# Patient Record
Sex: Female | Born: 1995 | Race: Black or African American | Hispanic: No | Marital: Single | Smoking: Never smoker
Health system: Southern US, Community
[De-identification: ages and names within clinical notes are randomized; demographics above are authoritative.]

## PROBLEM LIST (undated history)

## (undated) DIAGNOSIS — K219 Gastro-esophageal reflux disease without esophagitis: Secondary | ICD-10-CM

## (undated) HISTORY — PX: NO PAST SURGERIES: SHX2092

---

## 2015-05-29 ENCOUNTER — Encounter (HOSPITAL_COMMUNITY): Payer: Self-pay | Admitting: Family Medicine

## 2015-05-29 ENCOUNTER — Emergency Department (HOSPITAL_COMMUNITY)
Admission: EM | Admit: 2015-05-29 | Discharge: 2015-05-29 | Disposition: A | Discharge status: Home / Self Care | Payer: Medicaid Other | Attending: Emergency Medicine | Admitting: Emergency Medicine

## 2015-05-29 DIAGNOSIS — Z3202 Encounter for pregnancy test, result negative: Secondary | ICD-10-CM | POA: Diagnosis not present

## 2015-05-29 DIAGNOSIS — K219 Gastro-esophageal reflux disease without esophagitis: Secondary | ICD-10-CM | POA: Insufficient documentation

## 2015-05-29 DIAGNOSIS — R109 Unspecified abdominal pain: Secondary | ICD-10-CM | POA: Diagnosis present

## 2015-05-29 HISTORY — DX: Gastro-esophageal reflux disease without esophagitis: K21.9

## 2015-05-29 LAB — COMPREHENSIVE METABOLIC PANEL
ALBUMIN: 4.1 g/dL (ref 3.5–5.0)
ALK PHOS: 71 U/L (ref 38–126)
ALT: 14 U/L (ref 14–54)
AST: 18 U/L (ref 15–41)
Anion gap: 6 (ref 5–15)
BILIRUBIN TOTAL: 0.5 mg/dL (ref 0.3–1.2)
BUN: 6 mg/dL (ref 6–20)
CALCIUM: 9.4 mg/dL (ref 8.9–10.3)
CO2: 27 mmol/L (ref 22–32)
CREATININE: 0.69 mg/dL (ref 0.44–1.00)
Chloride: 104 mmol/L (ref 101–111)
GFR calc non Af Amer: >60 mL/min (ref >60)
GLUCOSE: 105 mg/dL — AB (ref 65–99)
Potassium: 3.9 mmol/L (ref 3.5–5.1)
SODIUM: 137 mmol/L (ref 135–145)
Total Protein: 7.7 g/dL (ref 6.5–8.1)

## 2015-05-29 LAB — URINALYSIS, ROUTINE W REFLEX MICROSCOPIC
BILIRUBIN URINE: NEGATIVE
GLUCOSE, UA: NEGATIVE mg/dL
HGB URINE DIPSTICK: NEGATIVE
KETONES UR: NEGATIVE mg/dL
Leukocytes, UA: NEGATIVE
Nitrite: NEGATIVE
PROTEIN: NEGATIVE mg/dL
Specific Gravity, Urine: 1.02 (ref 1.005–1.030)
Urobilinogen, UA: 1 mg/dL (ref 0.0–1.0)
pH: 7 (ref 5.0–8.0)

## 2015-05-29 LAB — CBC
HCT: 37.7 % (ref 36.0–46.0)
Hemoglobin: 12.9 g/dL (ref 12.0–15.0)
MCH: 27.8 pg (ref 26.0–34.0)
MCHC: 34.2 g/dL (ref 30.0–36.0)
MCV: 81.3 fL (ref 78.0–100.0)
PLATELETS: 405 10*3/uL — AB (ref 150–400)
RBC: 4.64 MIL/uL (ref 3.87–5.11)
RDW: 13.6 % (ref 11.5–15.5)
WBC: 10 10*3/uL (ref 4.0–10.5)

## 2015-05-29 LAB — POC URINE PREG, ED: Preg Test, Ur: NEGATIVE

## 2015-05-29 LAB — LIPASE, BLOOD: Lipase: 18 U/L — ABNORMAL LOW (ref 22–51)

## 2015-05-29 MED ORDER — OMEPRAZOLE 20 MG PO CPDR: 20.0000 mg | DELAYED_RELEASE_CAPSULE | Freq: Every day | ORAL | Status: DC

## 2015-05-29 MED ORDER — GI COCKTAIL ~~LOC~~
30.0000 mL | Freq: Once | ORAL | Status: AC
  Administered 2015-05-29: 30 mL via ORAL
  Filled 2015-05-29: qty 30

## 2015-05-29 NOTE — ED Notes (Signed)
Pt is from home [facility] Pt has a history of GERD. Last night, pt ate spaghetti and started experiencing upper gastric pain. Since being transported, pain has decreased.

## 2015-05-29 NOTE — ED Notes (Signed)
Patient is unable to urinate at this time and will try later. She is aware a urine sample is needed.

## 2015-05-29 NOTE — ED Provider Notes (Signed)
CSN: [id number]     Arrival date & time 05/29/15  0214 History   First MD Initiated Contact with Patient 05/29/15 [phone]     Chief Complaint  Patient presents with  . Abdominal Pain     (Consider location/radiation/quality/duration/timing/severity/associated sxs/prior Treatment) Patient is a 19 y.o. female presenting with GERD. The history is provided by the patient.  Gastroesophageal Reflux This is a recurrent problem. The current episode started 6 to 12 hours ago. The problem occurs constantly. The problem has not changed since onset.Pertinent negatives include no chest pain, no headaches and no shortness of breath. Nothing aggravates the symptoms. Nothing relieves the symptoms. She has tried nothing for the symptoms. The treatment provided no relief.    Past Medical History  Diagnosis Date  . GERD (gastroesophageal reflux disease)    History reviewed. No pertinent past surgical history. History reviewed. No pertinent family history. Social History  Substance Use Topics  . Smoking status: Never Smoker   . Smokeless tobacco: None  . Alcohol Use: No   OB History    No data available     Review of Systems  Respiratory: Negative for shortness of breath.   Cardiovascular: Negative for chest pain, palpitations and leg swelling.  Neurological: Negative for headaches.  All other systems reviewed and are negative.     Allergies  Review of patient's allergies indicates no known allergies.  Home Medications   Prior to Admission medications   Not on File   BP 135/71 mmHg  Pulse 63  Temp(Src) 98.9 F (37.2 C) (Oral)  Resp 18  Ht  (1.676 m)  Wt 264 lb (119.75 kg)  BMI 42.63 kg/m2  SpO2 100%  LMP 05/07/2015 Physical Exam  Constitutional: She is oriented to person, place, and time. She appears well-developed and well-nourished. No distress.  HENT:  Head: Normocephalic and atraumatic.  Mouth/Throat: Oropharynx is clear and moist.  Eyes: Conjunctivae and EOM are  normal. Pupils are equal, round, and reactive to light.  Neck: Normal range of motion. Neck supple.  Cardiovascular: Normal rate, regular rhythm and intact distal pulses.   Pulmonary/Chest: Effort normal and breath sounds normal. No respiratory distress. She has no wheezes. She has no rales.  Abdominal: Soft. Bowel sounds are increased. There is no tenderness. There is no rigidity, no rebound, no guarding, no tenderness at McBurney's point and negative Murphy's sign.  Musculoskeletal: Normal range of motion.  Neurological: She is alert and oriented to person, place, and time.  Skin: Skin is warm and dry.  Psychiatric: She has a normal mood and affect.    ED Course  Procedures (including critical care time) Labs Review Labs Reviewed  LIPASE, BLOOD - Abnormal; Notable for the following:    Lipase 18 (*)    All other components within normal limits  COMPREHENSIVE METABOLIC PANEL - Abnormal; Notable for the following:    Glucose, Bld 105 (*)    All other components within normal limits  CBC - Abnormal; Notable for the following:    Platelets 405 (*)    All other components within normal limits  URINALYSIS, ROUTINE W REFLEX MICROSCOPIC (NOT AT [facility]) - Abnormal; Notable for the following:    APPearance CLOUDY (*)    All other components within normal limits  POC URINE PREG, ED    Imaging Review No results found. I have personally reviewed and evaluated these images and lab results as part of my medical decision-making.   EKG Interpretation None      MDM  Final diagnoses:  None    GERD, GI cocktail, GERD friendly diet, no eating 2 hours before bedtime, and omeprazole.  Follow up with your PMD    Deshannon Seide, MD 05/29/15 [phone]

## 2015-09-29 ENCOUNTER — Emergency Department (HOSPITAL_COMMUNITY)
Admission: EM | Admit: 2015-09-29 | Discharge: 2015-09-29 | Disposition: A | Discharge status: Home / Self Care | Payer: Medicaid Other | Attending: Emergency Medicine | Admitting: Emergency Medicine

## 2015-09-29 ENCOUNTER — Encounter (HOSPITAL_COMMUNITY): Payer: Self-pay

## 2015-09-29 DIAGNOSIS — S7012XA Contusion of left thigh, initial encounter: Secondary | ICD-10-CM | POA: Insufficient documentation

## 2015-09-29 DIAGNOSIS — Y9289 Other specified places as the place of occurrence of the external cause: Secondary | ICD-10-CM | POA: Diagnosis not present

## 2015-09-29 DIAGNOSIS — S8012XA Contusion of left lower leg, initial encounter: Secondary | ICD-10-CM | POA: Diagnosis not present

## 2015-09-29 DIAGNOSIS — Z23 Encounter for immunization: Secondary | ICD-10-CM | POA: Diagnosis not present

## 2015-09-29 DIAGNOSIS — W171XXA Fall into storm drain or manhole, initial encounter: Secondary | ICD-10-CM | POA: Insufficient documentation

## 2015-09-29 DIAGNOSIS — Y9301 Activity, walking, marching and hiking: Secondary | ICD-10-CM | POA: Diagnosis not present

## 2015-09-29 DIAGNOSIS — Z79899 Other long term (current) drug therapy: Secondary | ICD-10-CM | POA: Diagnosis not present

## 2015-09-29 DIAGNOSIS — Y998 Other external cause status: Secondary | ICD-10-CM | POA: Insufficient documentation

## 2015-09-29 DIAGNOSIS — K219 Gastro-esophageal reflux disease without esophagitis: Secondary | ICD-10-CM | POA: Diagnosis not present

## 2015-09-29 DIAGNOSIS — S79922A Unspecified injury of left thigh, initial encounter: Secondary | ICD-10-CM | POA: Diagnosis present

## 2015-09-29 DIAGNOSIS — S70312A Abrasion, left thigh, initial encounter: Secondary | ICD-10-CM | POA: Insufficient documentation

## 2015-09-29 MED ORDER — TETANUS-DIPHTH-ACELL PERTUSSIS 5-2.5-18.5 LF-MCG/0.5 IM SUSP
0.5000 mL | Freq: Once | INTRAMUSCULAR | Status: AC
  Administered 2015-09-29: 0.5 mL via INTRAMUSCULAR
  Filled 2015-09-29: qty 0.5

## 2015-09-29 NOTE — ED Provider Notes (Signed)
CSN: [id number]   [id number] date & time 09/29/15  1811 History  By signing my name below, I, Emmanuella Mensah, attest that this documentation has been prepared under the direction and in the presence of Danis Pembleton, PA-C . Electronically Signed: Angelene Giovanni, ED Scribe. 09/29/2015. 6:44 PM.    Chief Complaint  Patient presents with  . Leg Injury   The history is provided by the patient. No language interpreter was used.   Marland KitchenHPI Comments: Abigail Perez is a 20 y.o. female who presents to the Emergency Department complaining of moderately painful abrasions to the lateral aspect of her left upper leg, pain to the medial aspect of her left lower leg, and pain behind the left knee s/p leg injury that occurred when she fell into a manhole earlier today. She explains that she was walking and she stepped into the manhole causing her left leg to be stuck needing someone to move the metal plate the normally covers the hole off her leg before she could get up. No LOC or head injuries. Pt is able to ambulate. No alleviating factors noted. Pt has not taken any medications PTA. Her tetanus vaccines is not UTD. No lacerations, fever, or chills.    Past Medical History  Diagnosis Date  . GERD (gastroesophageal reflux disease)    History reviewed. No pertinent past surgical history. No family history on file. Social History  Substance Use Topics  . Smoking status: Never Smoker   . Smokeless tobacco: None  . Alcohol Use: No   OB History    No data available     Review of Systems  Constitutional: Negative for fever and chills.  Musculoskeletal: Positive for myalgias and arthralgias. Negative for gait problem.  Skin: Negative for wound.       Abrasions to the lateral aspect of left upper leg      Allergies  Review of patient's allergies indicates no known allergies.  Home Medications   Prior to Admission medications   Medication Sig Start Date End Date Taking? Authorizing  Provider  omeprazole (PRILOSEC) 20 MG capsule Take 1 capsule (20 mg total) by mouth daily. 05/29/15   April Palumbo, MD   BP 131/79 mmHg  Pulse 76  Temp(Src) 98.4 F (36.9 C) (Oral)  Resp 16  SpO2 100%  LMP 09/12/2015 (Approximate) Physical Exam  Constitutional: She is oriented to person, place, and time. She appears well-developed and well-nourished. No distress.  HENT:  Head: Normocephalic and atraumatic.  Eyes: Conjunctivae and EOM are normal.  Neck: Neck supple. No tracheal deviation present.  Cardiovascular: Normal rate.   Pulmonary/Chest: Effort normal. No respiratory distress.  Musculoskeletal: Normal range of motion.  Contusion to the left proximal posterior calf, no open skin. Full range of motion of the ankle and knee. Small contusion with abrasion to the lateral distal left thigh. Patella is nontender and patella tendon is intact. knee joint is stable and negative anterior posterior drawer signs. No laxity or medial lateral stress. Ambulating with no difficulty.  Neurological: She is alert and oriented to person, place, and time.  Skin: Skin is warm and dry.  Psychiatric: She has a normal mood and affect. Her behavior is normal.  Nursing note and vitals reviewed.   ED Course  Procedures (including critical care time) DIAGNOSTIC STUDIES: Oxygen Saturation is 100% on RA, normal by my interpretation.    COORDINATION OF CARE: 6:40 PM- Pt advised of plan for treatment and pt agrees. Pt will receive a Tdap and an  ace wrap. Advised to keep leg elevated and ice it using Ibuprofen or Tylenol for pain. Return if pain worsens.    MDM   Final diagnoses:  Contusion of left calf, initial encounter  Contusion of left thigh, initial encounter  Abrasion of left thigh, initial encounter   Patient with injury to the left calf and left thigh after falling into a manhole. She is ambulatory. There is no bony tenderness over upper or lower leg. She has no pain with range of motion or  joint tenderness over left knee or ankle. Most of pain is a for soft tissue and muscle. Patient's tetanus was updated today. Ace wrap applied. Do not think she needs any imaging. Will discharge home with NSAIDs or Tylenol, follow-up as needed.  Filed Vitals:   09/29/15 1817  BP: 131/79  Pulse: 76  Temp: 98.4 F (36.9 C)  TempSrc: Oral  Resp: 16  SpO2: 100%    I personally performed the services described in this documentation, which was scribed in my presence. The recorded information has been reviewed and is accurate.   Jaynie Crumble, PA-C 09/29/15 1852  Raeford Razor, MD 09/29/15 Serena Croissant

## 2015-09-29 NOTE — ED Notes (Signed)
Pt presents with c/o left leg pain. Pt reports that she fell into a manhole earlier today. Pt reports the pain is right above her knee, ambulatory to triage.

## 2015-09-29 NOTE — Discharge Instructions (Signed)
Ibuprofen or tylenol for pain. Keep leg elevated. Ice several times a day. Ace wrap for support. Follow up with your doctor as needed.    Contusion A contusion is a deep bruise. Contusions are the result of a blunt injury to tissues and muscle fibers under the skin. The injury causes bleeding under the skin. The skin overlying the contusion may turn blue, purple, or yellow. Minor injuries will give you a painless contusion, but more severe contusions may stay painful and swollen for a few weeks.  CAUSES  This condition is usually caused by a blow, trauma, or direct force to an area of the body. SYMPTOMS  Symptoms of this condition include:  Swelling of the injured area.  Pain and tenderness in the injured area.  Discoloration. The area may have redness and then turn blue, purple, or yellow. DIAGNOSIS  This condition is diagnosed based on a physical exam and medical history. An X-ray, CT scan, or MRI may be needed to determine if there are any associated injuries, such as broken bones (fractures). TREATMENT  Specific treatment for this condition depends on what area of the body was injured. In general, the best treatment for a contusion is resting, icing, applying pressure to (compression), and elevating the injured area. This is often called the RICE strategy. Over-the-counter anti-inflammatory medicines may also be recommended for pain control.  HOME CARE INSTRUCTIONS   Rest the injured area.  If directed, apply ice to the injured area:  Put ice in a plastic bag.  Place a towel between your skin and the bag.  Leave the ice on for 20 minutes, 2-3 times per day.  If directed, apply light compression to the injured area using an elastic bandage. Make sure the bandage is not wrapped too tightly. Remove and reapply the bandage as directed by your health care provider.  If possible, raise (elevate) the injured area above the level of your heart while you are sitting or lying down.  Take  over-the-counter and prescription medicines only as told by your health care provider. SEEK MEDICAL CARE IF:  Your symptoms do not improve after several days of treatment.  Your symptoms get worse.  You have difficulty moving the injured area. SEEK IMMEDIATE MEDICAL CARE IF:   You have severe pain.  You have numbness in a hand or foot.  Your hand or foot turns pale or cold.   This information is not intended to replace advice given to you by your health care provider. Make sure you discuss any questions you have with your health care provider.   Document Released: 05/07/2005 Document Revised: 04/18/2015 Document Reviewed: 12/13/2014 Elsevier Interactive Patient Education [employer]

## 2016-10-10 ENCOUNTER — Ambulatory Visit: Payer: Medicaid Other | Attending: Family Medicine | Admitting: Family Medicine

## 2016-10-10 VITALS — BP 126/85 | HR 76 | Temp 97.7°F | Resp 18 | Ht 66.0 in | Wt 265.8 lb

## 2016-10-10 DIAGNOSIS — N6029 Fibroadenosis of unspecified breast: Secondary | ICD-10-CM

## 2016-10-10 DIAGNOSIS — N6452 Nipple discharge: Secondary | ICD-10-CM | POA: Diagnosis present

## 2016-10-10 DIAGNOSIS — N6459 Other signs and symptoms in breast: Secondary | ICD-10-CM | POA: Insufficient documentation

## 2016-10-10 DIAGNOSIS — Z0000 Encounter for general adult medical examination without abnormal findings: Secondary | ICD-10-CM

## 2016-10-10 DIAGNOSIS — Z793 Long term (current) use of hormonal contraceptives: Secondary | ICD-10-CM | POA: Insufficient documentation

## 2016-10-10 DIAGNOSIS — Z79899 Other long term (current) drug therapy: Secondary | ICD-10-CM | POA: Insufficient documentation

## 2016-10-10 DIAGNOSIS — N644 Mastodynia: Secondary | ICD-10-CM | POA: Insufficient documentation

## 2016-10-10 LAB — CBC WITH DIFFERENTIAL/PLATELET
BASOS ABS: 0 {cells}/uL (ref 0–200)
Basophils Relative: 0 %
EOS PCT: 1 %
Eosinophils Absolute: 87 cells/uL (ref 15–500)
HCT: 36.6 % (ref 35.0–45.0)
Hemoglobin: 12.3 g/dL (ref 11.7–15.5)
Lymphocytes Relative: 38 %
Lymphs Abs: 3306 cells/uL (ref 850–3900)
MCH: 28.1 pg (ref 27.0–33.0)
MCHC: 33.6 g/dL (ref 32.0–36.0)
MCV: 83.6 fL (ref 80.0–100.0)
MONOS PCT: 10 %
MPV: 9 fL (ref 7.5–12.5)
Monocytes Absolute: 870 cells/uL (ref 200–950)
NEUTROS PCT: 51 %
Neutro Abs: 4437 cells/uL (ref 1500–7800)
PLATELETS: 439 10*3/uL — AB (ref 140–400)
RBC: 4.38 MIL/uL (ref 3.80–5.10)
RDW: 13.4 % (ref 11.0–15.0)
WBC: 8.7 10*3/uL (ref 3.8–10.8)

## 2016-10-10 LAB — BASIC METABOLIC PANEL WITH GFR
BUN: 11 mg/dL (ref 7–25)
CHLORIDE: 104 mmol/L (ref 98–110)
CO2: 29 mmol/L (ref 20–31)
Calcium: 9.5 mg/dL (ref 8.6–10.2)
Creat: 0.72 mg/dL (ref 0.50–1.10)
GFR, Est African American: >89 mL/min (ref >=60)
GLUCOSE: 67 mg/dL (ref 65–99)
POTASSIUM: 4.3 mmol/L (ref 3.5–5.3)
Sodium: 140 mmol/L (ref 135–146)

## 2016-10-10 LAB — POCT URINE PREGNANCY: PREG TEST UR: NEGATIVE

## 2016-10-10 LAB — TSH: TSH: 0.99 m[IU]/L

## 2016-10-10 MED ORDER — IBUPROFEN 600 MG PO TABS: 600.0000 mg | ORAL_TABLET | Freq: Three times a day (TID) | ORAL | 0 refills | Status: DC | PRN

## 2016-10-10 NOTE — Progress Notes (Signed)
Patient is here for left breast discharge on Wednesday  Discharge since that day but complains about soreness and pain under armpit

## 2016-10-10 NOTE — Patient Instructions (Addendum)
Follow up with appointments for imaging. May apply warm compress to area to decrease tenderness/pain apply for no more than 20 mins. 3 to 4 times daily.  Mammogram A mammogram is an X-ray of the breasts that is done to check for changes that are not normal. This test can screen for and find any changes that may suggest breast cancer. This test can also help to find other changes and variations in the breast. What happens before the procedure?  Have this test done about 1-2 weeks after your period. This is usually when your breasts are the least tender.  If you are visiting a new doctor or clinic, send any past mammogram images to your new doctor's office.  Wash your breasts and under your arms the day of the test.  Do not use deodorants, perfumes, lotions, or powders on the day of the test.  Take off any jewelry from your neck.  Wear clothes that you can change into and out of easily. What happens during the procedure?  You will undress from the waist up. You will put on a gown.  You will stand in front of the X-ray machine.  Each breast will be placed between two plastic or glass plates. The plates will press down on your breast for a few seconds. Try to stay as relaxed as possible. This does not cause any harm to your breasts. Any discomfort you feel will be very brief.  X-rays will be taken from different angles of each breast. The procedure may vary among doctors and hospitals. What happens after the procedure?  The mammogram will be looked at by a specialist (radiologist).  You may need to do certain parts of the test again. This depends on the quality of the images.  Ask when your test results will be ready. Make sure you get your test results.  You may go back to your normal activities. This information is not intended to replace advice given to you by your health care provider. Make sure you discuss any questions you have with your health care provider. Document  Released: 10/24/2008 Document Revised: 01/03/2016 Document Reviewed: 10/06/2014 Elsevier Interactive Patient Education  2017 Elsevier Inc.  Breast Self-Awareness Breast self-awareness means:  Knowing how your breasts look.  Knowing how your breasts feel.  Checking your breasts every month for changes.  Telling your doctor if you notice a change in your breasts. Breast self-awareness allows you to notice a breast problem early while it is still small. How to do a breast self-exam One way to learn what is normal for your breasts and to check for changes is to do a breast self-exam. To do a breast self-exam: Look for Changes   1. Take off all the clothes above your waist. 2. Stand in front of a mirror in a room with good lighting. 3. Put your hands on your hips. 4. Push your hands down. 5. Look at your breasts and nipples in the mirror to see if one breast or nipple looks different than the other. Check to see if:  The shape of one breast is different.  The size of one breast is different.  There are wrinkles, dips, and bumps in one breast and not the other. 6. Look at each breast for changes in your skin, such as:  Redness.  Scaly areas. 7. Look for changes in your nipples, such as:  Liquid around the nipples.  Bleeding.  Dimpling.  Redness.  A change in where the nipples are. Feel  for Changes  1. Lie on your back on the floor. 2. Feel each breast. To do this, follow these steps:  Pick a breast to feel.  Put the arm closest to that breast above your head.  Use your other arm to feel the nipple area of your breast. Feel the area with the pads of your three middle fingers by making small circles with your fingers. For the first circle, press lightly. For the second circle, press harder. For the third circle, press even harder.  Keep making circles with your fingers at the light, harder, and even harder pressures as you move down your breast. Stop when you feel your  ribs.  Move your fingers a little toward the center of your body.  Start making circles with your fingers again, this time going up until you reach your collarbone.  Keep making up and down circles until you reach your armpit. Remember to keep using the three pressures.  Feel the other breast in the same way. 3. Sit or stand in the shower or tub. 4. With soapy water on your skin, feel each breast the same way you did in step 2, when you were lying on the floor. Write Down What You Find   After doing the self-exam, write down:  What is normal for each breast.  Any changes you find in each breast.  When you last had your period. How often should I check my breasts? Check your breasts every month. If you are breastfeeding, the best time to check them is after you feed your baby or after you use a breast pump. If you get periods, the best time to check your breasts is 5-7 days after your period is over. When should I see my doctor? See your doctor if you notice:  A change in shape or size of your breasts or nipples.  A change in the skin of your breast or nipples, such as red or scaly skin.  Unusual fluid coming from your nipples.  A lump or thick area that was not there before.  Pain in your breasts.  Anything that concerns you. This information is not intended to replace advice given to you by your health care provider. Make sure you discuss any questions you have with your health care provider. Document Released: 01/14/2008 Document Revised: 01/03/2016 Document Reviewed: 06/17/2015 Elsevier Interactive Patient Education  2017 Elsevier Inc.   [employer]Heat Therapy Heat therapy can help ease sore, stiff, injured, and tight muscles and joints. Heat relaxes your muscles, which may help ease your pain. Heat therapy should only be used on old, pre-existing, or long-lasting (chronic) injuries. Do not use heat therapy unless told by your doctor. How to use heat therapy There are several  different kinds of heat therapy, including:  Moist heat pack.  Warm water bath.  Hot water bottle.  Electric heating pad.  Heated gel pack.  Heated wrap.  Electric heating pad. General heat therapy recommendations  Do not sleep while using heat therapy. Only use heat therapy while you are awake.  Your skin may turn pink while using heat therapy. Do not use heat therapy if your skin turns red.  Do not use heat therapy if you have new pain.  High heat or long exposure to heat can cause burns. Be careful when using heat therapy to avoid burning your skin.  Do not use heat therapy on areas of your skin that are already irritated, such as with a rash or sunburn. Get help  if:  You have blisters, redness, swelling (puffiness), or numbness.  You have new pain.  Your pain is worse. This information is not intended to replace advice given to you by your health care provider. Make sure you discuss any questions you have with your health care provider. Document Released: 10/20/2011 Document Revised: 01/03/2016 Document Reviewed: 09/20/2013 Elsevier Interactive Patient Education  2017 [employer].

## 2016-10-10 NOTE — Progress Notes (Signed)
   Subjective:  Patient ID: Abigail Perez, female    DOB: 11/30/1995  Age: 21 y.o. MRN: [id number]  CC: Establish Care   HPI Abigail Perez presents for  Breast drainage: First episode occurred 2 weeks ago. Second episode reoccurred on Wednesday. Drainage is non odorous, Nipple sore for 2 to 3 hours prior and breast tenderness. Denies any history of thyroid problems or irregular periods. Reports taking hormonal birth control Microgestin FE 5 to 6 months. Reports this is her first time taking hormonal contraceptives. Denies any family history of breast cancer. Reports being inconsistent with monthly SBE.   Outpatient Medications Prior to Visit  Medication Sig Dispense Refill  . omeprazole (PRILOSEC) 20 MG capsule Take 1 capsule (20 mg total) by mouth daily. 30 capsule 0   No facility-administered medications prior to visit.     ROS Review of Systems  Constitutional: Negative.   Respiratory: Negative.   Cardiovascular: Negative.   Skin:       Nipple discharge    Objective:  BP 126/85 (BP Location: Left Arm, Patient Position: Sitting, Cuff Size: Normal)   Pulse 76   Temp 97.7 F (36.5 C) (Oral)   Resp 18   Ht 5\' 6"  (1.676 m)   Wt 265 lb 12.8 oz (120.6 kg)   LMP 09/26/2016   SpO2 99%   BMI 42.90 kg/m   BP/Weight 10/10/2016 09/29/2015 05/29/2015  Systolic BP 126 131 116  Diastolic BP 85 79 72  Wt. (Lbs) 265.8 - 264  BMI 42.9 - 42.63     Physical Exam  Cardiovascular: Normal rate, regular rhythm, normal heart sounds and intact distal pulses.   Pulmonary/Chest: Effort normal and breath sounds normal. Left breast exhibits nipple discharge.    Abdominal: Soft. Bowel sounds are normal.  Lymphadenopathy:    She has no cervical adenopathy.  Skin: Skin is warm and dry.  Lumpy breasts. No denting or dimpling of the breast.  Nursing note and vitals reviewed.    Assessment & Plan:   Problem List Items Addressed This Visit    None    Visit Diagnoses    Nipple  discharge    -  Primary   Relevant Orders   Prolactin (Completed)   MM DIAG BREAST TOMO BILATERAL   TSH (Completed)   CBC with Differential (Completed)   US BREAST LTD UNI LEFT INC AXILLA   POCT urine pregnancy (Completed)   BASIC METABOLIC PANEL WITH GFR (Completed)   Lumpy breasts, unspecified laterality       Relevant Orders   MM DIAG BREAST TOMO BILATERAL   US BREAST LTD UNI LEFT INC AXILLA   US BREAST LTD UNI RIGHT INC AXILLA   Healthcare maintenance       Relevant Orders   HIV antibody (with reflex) (Completed)   Breast tenderness in female       Relevant Medications   ibuprofen (ADVIL,MOTRIN) 600 MG tablet      Meds ordered this encounter  Medications  . ibuprofen (ADVIL,MOTRIN) 600 MG tablet    Sig: Take 1 tablet (600 mg total) by mouth every 8 (eight) hours as needed.    Dispense:  30 tablet    Refill:  0    Order Specific Question:   Supervising Provider    Answer:   Quentin AngstJEGEDE, OLUGBEMIGA E [id number]    Follow-up: Return if symptoms worsen or fail to improve.   Lizbeth BarkMandesia R Sabastian Raimondi FNP

## 2016-10-11 LAB — HIV ANTIBODY (ROUTINE TESTING W REFLEX): HIV: NONREACTIVE

## 2016-10-11 LAB — PROLACTIN: PROLACTIN: 6 ng/mL

## 2016-10-17 ENCOUNTER — Telehealth: Payer: Self-pay

## 2016-10-17 NOTE — Telephone Encounter (Signed)
-----   Message from Lizbeth BarkMandesia R Hairston, FNP sent at 10/17/2016  9:21 AM EST ----- HIV is negative.  Prolactin is normal. Elevated prolactin levels can cause nipple discharge. Labs normal Follow up with breast imaging appointment.

## 2016-10-17 NOTE — Telephone Encounter (Signed)
CMA call to go over lab results  Patient Verify DOB  Patient was aware and understood   

## 2016-10-27 ENCOUNTER — Other Ambulatory Visit: Payer: Medicaid Other

## 2017-02-17 ENCOUNTER — Ambulatory Visit: Payer: Medicaid Other | Attending: Family Medicine | Admitting: Family Medicine

## 2017-02-17 VITALS — BP 118/77 | HR 76 | Temp 98.6°F | Resp 18 | Ht 66.0 in | Wt 264.2 lb

## 2017-02-17 DIAGNOSIS — B354 Tinea corporis: Secondary | ICD-10-CM | POA: Insufficient documentation

## 2017-02-17 DIAGNOSIS — L720 Epidermal cyst: Secondary | ICD-10-CM | POA: Insufficient documentation

## 2017-02-17 MED ORDER — TERBINAFINE HCL 250 MG PO TABS: 250.0000 mg | ORAL_TABLET | Freq: Every day | ORAL | 0 refills | Status: DC

## 2017-02-17 MED ORDER — KETOCONAZOLE 2 % EX CREA: 1.0000 "application " | TOPICAL_CREAM | Freq: Every day | CUTANEOUS | 1 refills | Status: DC

## 2017-02-17 NOTE — Patient Instructions (Signed)

## 2017-02-17 NOTE — Progress Notes (Signed)
   Subjective:  Patient ID: Abigail Perez, female    DOB: 08/27/1995  Age: 21 y.o. MRN: [id number]  CC: Establish Care   HPI Abigail ClintonBrianna Landsberg presents of probable tinea. Lesion is located on the abdomen, back and bilateral leg. Symptoms include patches of scaly skin. Symptoms have been ongoing for about 3 weeks. Denies taking anything for symptoms. Patient denies any new contacts with similar symptoms. Patient complaints of lump to right sided neck. 10 year history. Denies any consitutional symptoms,  history of injury, or drainage.     Outpatient Medications Prior to Visit  Medication Sig Dispense Refill  . ibuprofen (ADVIL,MOTRIN) 600 MG tablet Take 1 tablet (600 mg total) by mouth every 8 (eight) hours as needed. 30 tablet 0  . omeprazole (PRILOSEC) 20 MG capsule Take 1 capsule (20 mg total) by mouth daily. 30 capsule 0   No facility-administered medications prior to visit.     ROS Review of Systems  Constitutional: Negative.   Respiratory: Negative.   Cardiovascular: Negative.   Gastrointestinal: Negative.   Skin: Positive for rash.     Objective:  BP 118/77 (BP Location: Left Arm, Patient Position: Sitting, Cuff Size: Normal)   Pulse 76   Temp 98.6 F (37 C) (Oral)   Resp 18   Ht 5\' 6"  (1.676 m)   Wt 264 lb 3.2 oz (119.8 kg)   SpO2 98%   BMI 42.64 kg/m   BP/Weight 02/17/2017 10/10/2016 09/29/2015  Systolic BP 118 126 131  Diastolic BP 77 85 79  Wt. (Lbs) 264.2 265.8 -  BMI 42.64 42.9 -     Physical Exam  Eyes: Pupils are equal, round, and reactive to light. Conjunctivae are normal.  Neck: No JVD present.  Cardiovascular: Normal rate, regular rhythm, normal heart sounds and intact distal pulses.   Pulmonary/Chest: Effort normal and breath sounds normal.  Abdominal: Soft. Bowel sounds are normal.  Skin: Skin is warm and dry.  Circular, scaly, hyperpigmented rashes to bilateral legs, anterior chest and back.  Nursing note and vitals reviewed.    Assessment &  Plan:   Problem List Items Addressed This Visit    None    Visit Diagnoses    Tinea corporis    -  Primary   Relevant Medications   terbinafine (LAMISIL) 250 MG tablet   ketoconazole (NIZORAL) 2 % cream   Epidermoid cyst of neck          Meds ordered this encounter  Medications  . terbinafine (LAMISIL) 250 MG tablet    Sig: Take 1 tablet (250 mg total) by mouth daily.    Dispense:  14 tablet    Refill:  0    Order Specific Question:   Supervising Provider    Answer:   Quentin AngstJEGEDE, OLUGBEMIGA E [id number]]  . ketoconazole (NIZORAL) 2 % cream    Sig: Apply 1 application topically daily. Apply to affected areas. For 4 weeks.    Dispense:  15 g    Refill:  1    Order Specific Question:   Supervising Provider    Answer:   Quentin AngstJEGEDE, OLUGBEMIGA E [id number]]    Follow-up: Return in about 2 weeks (around 03/03/2017) for Routine PAP .   Lizbeth BarkMandesia R Charizma Gardiner FNP

## 2017-02-17 NOTE — Progress Notes (Signed)
Patient is here for dry patches of skin all over body

## 2017-03-23 ENCOUNTER — Other Ambulatory Visit: Payer: Medicaid Other | Admitting: Family Medicine

## 2018-06-30 ENCOUNTER — Other Ambulatory Visit: Payer: Self-pay

## 2018-06-30 ENCOUNTER — Encounter (HOSPITAL_COMMUNITY): Payer: Self-pay | Admitting: Emergency Medicine

## 2018-06-30 ENCOUNTER — Emergency Department (HOSPITAL_COMMUNITY): Payer: No Typology Code available for payment source

## 2018-06-30 ENCOUNTER — Emergency Department (HOSPITAL_COMMUNITY)
Admission: EM | Admit: 2018-06-30 | Discharge: 2018-06-30 | Disposition: A | Discharge status: Home / Self Care | Payer: No Typology Code available for payment source | Attending: Emergency Medicine | Admitting: Emergency Medicine

## 2018-06-30 DIAGNOSIS — M546 Pain in thoracic spine: Secondary | ICD-10-CM | POA: Insufficient documentation

## 2018-06-30 DIAGNOSIS — Z79899 Other long term (current) drug therapy: Secondary | ICD-10-CM | POA: Diagnosis not present

## 2018-06-30 MED ORDER — IBUPROFEN 800 MG PO TABS
800.0000 mg | ORAL_TABLET | Freq: Once | ORAL | Status: AC
  Administered 2018-06-30: 800 mg via ORAL
  Filled 2018-06-30: qty 1

## 2018-06-30 MED ORDER — METHOCARBAMOL 500 MG PO TABS: 500.0000 mg | ORAL_TABLET | Freq: Two times a day (BID) | ORAL | 0 refills | Status: DC

## 2018-06-30 MED ORDER — METHOCARBAMOL 500 MG PO TABS
500.0000 mg | ORAL_TABLET | Freq: Once | ORAL | Status: AC
  Administered 2018-06-30: 500 mg via ORAL
  Filled 2018-06-30: qty 1

## 2018-06-30 MED ORDER — NAPROXEN 500 MG PO TABS: 500.0000 mg | ORAL_TABLET | Freq: Two times a day (BID) | ORAL | 0 refills | Status: DC
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
## 2018-06-30 NOTE — ED Notes (Signed)
Patient transported to X-ray 
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
## 2018-06-30 NOTE — ED Notes (Signed)
Bed: WTR8 Expected date:  Expected time:  Means of arrival:  Comments: 
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
## 2019-11-06 ENCOUNTER — Emergency Department (HOSPITAL_COMMUNITY)
Admission: EM | Admit: 2019-11-06 | Discharge: 2019-11-06 | Disposition: A | Discharge status: Home / Self Care | Payer: Medicaid Other | Attending: Emergency Medicine | Admitting: Emergency Medicine

## 2019-11-06 ENCOUNTER — Encounter (HOSPITAL_COMMUNITY): Payer: Self-pay | Admitting: Emergency Medicine

## 2019-11-06 DIAGNOSIS — Y929 Unspecified place or not applicable: Secondary | ICD-10-CM | POA: Insufficient documentation

## 2019-11-06 DIAGNOSIS — Y33XXXA Other specified events, undetermined intent, initial encounter: Secondary | ICD-10-CM | POA: Diagnosis not present

## 2019-11-06 DIAGNOSIS — B9689 Other specified bacterial agents as the cause of diseases classified elsewhere: Secondary | ICD-10-CM | POA: Insufficient documentation

## 2019-11-06 DIAGNOSIS — S3141XA Laceration without foreign body of vagina and vulva, initial encounter: Secondary | ICD-10-CM | POA: Insufficient documentation

## 2019-11-06 DIAGNOSIS — Z23 Encounter for immunization: Secondary | ICD-10-CM | POA: Diagnosis not present

## 2019-11-06 DIAGNOSIS — O9A219 Injury, poisoning and certain other consequences of external causes complicating pregnancy, unspecified trimester: Secondary | ICD-10-CM | POA: Diagnosis present

## 2019-11-06 DIAGNOSIS — Y939 Activity, unspecified: Secondary | ICD-10-CM | POA: Insufficient documentation

## 2019-11-06 DIAGNOSIS — Z3A00 Weeks of gestation of pregnancy not specified: Secondary | ICD-10-CM | POA: Insufficient documentation

## 2019-11-06 DIAGNOSIS — N760 Acute vaginitis: Secondary | ICD-10-CM | POA: Diagnosis not present

## 2019-11-06 DIAGNOSIS — Y999 Unspecified external cause status: Secondary | ICD-10-CM | POA: Diagnosis not present

## 2019-11-06 LAB — CBC WITH DIFFERENTIAL/PLATELET
Abs Immature Granulocytes: 0.04 10*3/uL (ref 0.00–0.07)
Basophils Absolute: 0 10*3/uL (ref 0.0–0.1)
Basophils Relative: 0 %
Eosinophils Absolute: 0.1 10*3/uL (ref 0.0–0.5)
Eosinophils Relative: 1 %
HCT: 37.3 % (ref 36.0–46.0)
Hemoglobin: 12.2 g/dL (ref 12.0–15.0)
Immature Granulocytes: 0 %
Lymphocytes Relative: 21 %
Lymphs Abs: 2.3 10*3/uL (ref 0.7–4.0)
MCH: 28.2 pg (ref 26.0–34.0)
MCHC: 32.7 g/dL (ref 30.0–36.0)
MCV: 86.1 fL (ref 80.0–100.0)
Monocytes Absolute: 0.6 10*3/uL (ref 0.1–1.0)
Monocytes Relative: 6 %
Neutro Abs: 7.6 10*3/uL (ref 1.7–7.7)
Neutrophils Relative %: 72 %
Platelets: 464 10*3/uL — ABNORMAL HIGH (ref 150–400)
RBC: 4.33 MIL/uL (ref 3.87–5.11)
RDW: 13.1 % (ref 11.5–15.5)
WBC: 10.6 10*3/uL — ABNORMAL HIGH (ref 4.0–10.5)
nRBC: 0 % (ref 0.0–0.2)

## 2019-11-06 LAB — COMPREHENSIVE METABOLIC PANEL
ALT: 12 U/L (ref 0–44)
AST: 11 U/L — ABNORMAL LOW (ref 15–41)
Albumin: 3.7 g/dL (ref 3.5–5.0)
Alkaline Phosphatase: 46 U/L (ref 38–126)
Anion gap: 7 (ref 5–15)
BUN: 6 mg/dL (ref 6–20)
CO2: 24 mmol/L (ref 22–32)
Calcium: 8.9 mg/dL (ref 8.9–10.3)
Chloride: 107 mmol/L (ref 98–111)
Creatinine, Ser: 0.63 mg/dL (ref 0.44–1.00)
GFR calc Af Amer: >60 mL/min (ref >60)
GFR calc non Af Amer: >60 mL/min (ref >60)
Glucose, Bld: 99 mg/dL (ref 70–99)
Potassium: 4 mmol/L (ref 3.5–5.1)
Sodium: 138 mmol/L (ref 135–145)
Total Bilirubin: 0.6 mg/dL (ref 0.3–1.2)
Total Protein: 7.3 g/dL (ref 6.5–8.1)

## 2019-11-06 LAB — URINALYSIS, ROUTINE W REFLEX MICROSCOPIC
Bacteria, UA: NONE SEEN
Bilirubin Urine: NEGATIVE
Glucose, UA: NEGATIVE mg/dL
Ketones, ur: NEGATIVE mg/dL
Nitrite: NEGATIVE
Protein, ur: NEGATIVE mg/dL
Specific Gravity, Urine: 1.015 (ref 1.005–1.030)
pH: 6 (ref 5.0–8.0)

## 2019-11-06 LAB — I-STAT BETA HCG BLOOD, ED (MC, WL, AP ONLY): I-stat hCG, quantitative: >2000 m[IU]/mL — ABNORMAL HIGH (ref <5)

## 2019-11-06 LAB — WET PREP, GENITAL
Trich, Wet Prep: NONE SEEN
Yeast Wet Prep HPF POC: NONE SEEN

## 2019-11-06 LAB — HIV ANTIBODY (ROUTINE TESTING W REFLEX): HIV Screen 4th Generation wRfx: NONREACTIVE

## 2019-11-06 LAB — ABO/RH: ABO/RH(D): O POS

## 2019-11-06 LAB — HCG, QUANTITATIVE, PREGNANCY: hCG, Beta Chain, Quant, S: 12997 m[IU]/mL — ABNORMAL HIGH (ref <5)

## 2019-11-06 MED ORDER — METRONIDAZOLE 500 MG PO TABS: 500.0000 mg | ORAL_TABLET | Freq: Two times a day (BID) | ORAL | 0 refills | Status: DC

## 2019-11-06 MED ORDER — LIDOCAINE HCL (PF) 1 % IJ SOLN
10.0000 mL | Freq: Once | INTRAMUSCULAR | Status: AC
  Administered 2019-11-06: 10 mL
  Filled 2019-11-06: qty 30

## 2019-11-06 MED ORDER — TETANUS-DIPHTH-ACELL PERTUSSIS 5-2.5-18.5 LF-MCG/0.5 IM SUSP
0.5000 mL | Freq: Once | INTRAMUSCULAR | Status: AC
  Administered 2019-11-06: 0.5 mL via INTRAMUSCULAR
  Filled 2019-11-06: qty 0.5

## 2019-11-06 MED ORDER — LIDOCAINE-EPINEPHRINE-TETRACAINE (LET) TOPICAL GEL
3.0000 mL | Freq: Once | TOPICAL | Status: AC
  Administered 2019-11-06: 3 mL via TOPICAL
  Filled 2019-11-06: qty 3

## 2019-11-06 NOTE — ED Triage Notes (Signed)
Per pt, states she is on oral BCP-states during  intercourse she noticed she was bleeding-when she took shower she noticed she was bleeding and passing clots-last menstrual cycles was in [facility] [place] pregnancy test this am and it was postive

## 2019-11-06 NOTE — Discharge Instructions (Addendum)
Noticed with a vaginal laceration today.  You were incidentally found to have an overgrowth of the normal bacteria in your vaginal canal during her exam as well.  As you are pregnant you are at increased risk for this causing issues pregnancy.  For this reason we will go ahead and treat with antibiotics.  He will take Flagyl for 1 week.  Do not drink any alcohol.  [facility]  Please call tomorrow morning to arrange for follow-up appointment.  Your beta hCG was 13,000 which is consistent with a 6 to 8-week pregnancy.  Please stop taking your hormonal contraceptive pill.  Please drink plenty of water and consider taking a multivitamin.  Please return to ED if you have any new or concerning symptoms.  There will be some small amount of bleeding from where the laceration was done this is normal.  If there is any redness swelling or worsening pain in her vaginal canal please follow-up with the OB/GYN sooner.

## 2019-11-06 NOTE — ED Provider Notes (Signed)
[facility]-EMERGENCY DEPT Provider Note   CSN: [id number] Arrival date & time: 11/06/19  1012     History Chief Complaint  Patient presents with  . Vaginal Bleeding    Abigail Perez is a 24 y.o. female.  HPI Patient is a 24 year old female with past medical history significant for GERD and no abdominal surgeries presented today with recent discovery that she is pregnant has received no prenatal care to date.  She states that her last menstrual period started January 29 however she states that she is taking a oral contraceptive medication and only experiences a menstrual cycle every 6 months when she discontinues her OCP.  She states that she is monogamous with single partner and states that at 3 AM this morning she was having sex with her partner when she felt a sudden sharp pain in the right side of her vagina has had burning pain since that time.  She states that she stopped having sex and saw that there was blood on her partner's penis as well as some blood coming out of her vagina.  She states that she was concerned for a this.  And presented to the ED for evaluation.  She denies any abdominal pain, weakness, lightheadedness, dizziness, fatigue, nausea, vomiting, vaginal discharge--apart from bleeding--and denies any pain prior to the sudden onset of pain that occurred with the bleeding.  She denies any history of ectopic pregnancies, states that she is uncertain when exactly she became pregnant.      Past Medical History:  Diagnosis Date  . GERD (gastroesophageal reflux disease)     There are no problems to display for this patient.   History reviewed. No pertinent surgical history.   OB History   No obstetric history on file.     No family history on file.  Social History   Tobacco Use  . Smoking status: Never Smoker  Substance Use Topics  . Alcohol use: No  . Drug use: No    Home Medications Prior to Admission medications     Medication Sig Start Date End Date Taking? Authorizing Provider  Levonorgestrel-Ethinyl Estradiol (ASHLYNA) 0.15-0.03 &0.01 MG tablet Take 1 tablet by mouth daily.  11/06/19 Yes [provider]  metroNIDAZOLE (FLAGYL) 500 MG tablet Take 1 tablet (500 mg total) by mouth 2 (two) times daily. 11/06/19   Gailen Shelter, PA  omeprazole (PRILOSEC) 20 MG capsule Take 1 capsule (20 mg total) by mouth daily. Patient not taking: Reported on 11/06/2019 05/29/15 11/06/19  Nicanor Alcon, April, MD    Allergies    Patient has no known allergies.  Review of Systems   Review of Systems  Constitutional: Negative for chills and fever.  HENT: Negative for congestion.   Eyes: Negative for pain.  Respiratory: Negative for cough and shortness of breath.   Cardiovascular: Negative for chest pain and leg swelling.  Gastrointestinal: Negative for abdominal pain, nausea and vomiting.  Genitourinary: Positive for vaginal bleeding and vaginal pain. Negative for dysuria and hematuria.  Musculoskeletal: Negative for myalgias.  Skin: Negative for rash.  Neurological: Negative for dizziness and headaches.    Physical Exam Updated Vital Signs BP (!) 147/106   Pulse 73   Temp 98.1 F (36.7 C) (Oral)   Resp 18   Ht 5\' 7"  (1.702 m)   Wt 124.3 kg   LMP 09/08/2019   SpO2 99%   BMI 42.91 kg/m   Physical Exam Vitals and nursing note reviewed.  Constitutional:      General:  She is not in acute distress.    Comments: Pleasant 24 year old female no acute stress. Able to answer Qs appropriately and follow commands  HENT:     Head: Normocephalic and atraumatic.     Nose: Nose normal.     Mouth/Throat:     Mouth: Mucous membranes are moist.  Eyes:     General: No scleral icterus. Cardiovascular:     Rate and Rhythm: Normal rate and regular rhythm.     Pulses: Normal pulses.     Heart sounds: Normal heart sounds.  Pulmonary:     Effort: Pulmonary effort is normal. No respiratory distress.     Breath  sounds: No wheezing.  Abdominal:     Palpations: Abdomen is soft.     Tenderness: There is no abdominal tenderness. There is no right CVA tenderness, left CVA tenderness, guarding or rebound.     Comments: Obese abdomen, no tenderness, guarding, rebound, no palpable masses, no CVA tenderness  Genitourinary:    Comments: Vulva without lesions or abnormality Vaginal canal without abnormal discharge.  There is a 2.5 cm tear in the vaginal mucosa of the right side just internal to the labia minora Cervix appears normal, is closed, no bleeding from cervix No adnexal tenderness or CMT Musculoskeletal:     Cervical back: Normal range of motion.     Right lower leg: No edema.     Left lower leg: No edema.  Skin:    General: Skin is warm and dry.     Capillary Refill: Capillary refill takes less than 2 seconds.  Neurological:     Mental Status: She is alert. Mental status is at baseline.  Psychiatric:        Mood and Affect: Mood normal.        Behavior: Behavior normal.     ED Results / Procedures / Treatments   Labs (all labs ordered are listed, but only abnormal results are displayed) Labs Reviewed  WET PREP, GENITAL - Abnormal; Notable for the following components:      Result Value   Clue Cells Wet Prep HPF POC PRESENT (*)    WBC, Wet Prep HPF POC FEW (*)    All other components within normal limits  CBC WITH DIFFERENTIAL/PLATELET - Abnormal; Notable for the following components:   WBC 10.6 (*)    Platelets 464 (*)    All other components within normal limits  COMPREHENSIVE METABOLIC PANEL - Abnormal; Notable for the following components:   AST 11 (*)    All other components within normal limits  HCG, QUANTITATIVE, PREGNANCY - Abnormal; Notable for the following components:   hCG, Beta Chain, Quant, S 12,997 (*)    All other components within normal limits  URINALYSIS, ROUTINE W REFLEX MICROSCOPIC - Abnormal; Notable for the following components:   Hgb urine dipstick MODERATE  (*)    Leukocytes,Ua TRACE (*)    All other components within normal limits  I-STAT BETA HCG BLOOD, ED ([facility], [facility], [facility] ONLY) - Abnormal; Notable for the following components:   I-stat hCG, quantitative >2,000.0 (*)    All other components within normal limits  URINE CULTURE  RPR  HIV ANTIBODY (ROUTINE TESTING W REFLEX)  ABO/RH  GC/CHLAMYDIA PROBE AMP ([place]) NOT AT [facility]    EKG None  Radiology No results found.  Procedures .Marland KitchenLaceration Repair  Date/Time: 11/06/2019 4:24 PM Performed by: Gailen Shelter, PA Authorized by: Gailen Shelter, PA   Consent:    Consent obtained:  Verbal   Consent given by:  Patient   Risks discussed:  Infection, need for additional repair, pain, poor cosmetic result and poor wound healing   Alternatives discussed:  No treatment and delayed treatment Universal protocol:    Procedure explained and questions answered to patient or proxy's satisfaction: yes     Relevant documents present and verified: yes     Test results available and properly labeled: yes     Imaging studies available: yes     Required blood products, implants, devices, and special equipment available: yes     Site/side marked: yes     Immediately prior to procedure, a time out was called: yes     Patient identity confirmed:  Verbally with patient Anesthesia (see MAR for exact dosages):    Anesthesia method:  Local infiltration   Local anesthetic:  Lidocaine 1% w/o epi Laceration details:    Location:  Anogenital   Anogenital location:  Vagina   Length (cm):  2.5 Exploration:    Hemostasis achieved with:  Direct pressure and LET   Wound exploration: wound explored through full range of motion     Wound extent: no foreign bodies/material noted and no tendon damage noted     Contaminated: no   Treatment:    Area cleansed with:  Saline   Amount of cleaning:  Standard   Irrigation solution:  Sterile saline   Irrigation volume:  500 cc   Irrigation method:  Pressure  wash   Visualized foreign bodies/material removed: no   Skin repair:    Repair method:  Sutures   Suture size:  4-0   Suture material:  Fast-absorbing gut   Number of sutures:  3 Approximation:    Approximation:  Close Post-procedure details:    Patient tolerance of procedure:  Tolerated well, no immediate complications   (including critical care time)  Medications Ordered in ED Medications  Tdap (BOOSTRIX) injection 0.5 mL (0.5 mLs Intramuscular Given 11/06/19 1314)  lidocaine-EPINEPHrine-tetracaine (LET) topical gel (3 mLs Topical Given 11/06/19 1313)  lidocaine (PF) (XYLOCAINE) 1 % injection 10 mL (10 mLs Infiltration Given 11/06/19 1329)    ED Course  I have reviewed the triage vital signs and the nursing notes.  Pertinent labs & imaging results that were available during my care of the patient were reviewed by me and considered in my medical decision making (see chart for details).  Clinical Course as of Nov 05 1624  Sun Nov 06, 2019  1415 HCG, Beta Chain, Quant, Chauncey Cruel(!): 12,997 [WF]    Clinical Course User Index [WF] Tedd Sias, [place]   MDM Rules/Calculators/A&P                      Patient is a 24 year old female recently discovered that she was pregnant via PACS this morning.  She states that she was having sex at 3 AM this morning and had a sudden onset of sharp pain in the right side of her vagina and began experiencing some vaginal bleeding.  She denies any abdominal pain, nausea, vomiting.  She has no history of ectopic pregnancies.  Has not been pregnant before.  She is currently on birth control however she states that she went office for 2 weeks to allow for her.  To occur at the end of January.  She states that that was her last menstrual period.  She states that she was sexually active during that time and while restarting her oral contraceptive.  Patient has  on physical exam a small right-sided vaginal tear.  This was sutured with 3x 3.0 fast-absorbing gut simple  interrupted sutures.  Bleeding ceased at this time.  Vaginal exam with no other abnormalities.  No other lacerations and cervix is closed there is no vaginal bleeding in the canal.  There is no bleeding from the cervix.  There is no CMT or adnexal tenderness.  Doubt ectopic pregnancy given that patient has no abdominal pain, lightheadedness, dizziness or other symptoms.  Patient has mildly elevated blood pressure but states that this occurs frequently when she is seen by medical providers.  She has no chest pain, abdominal pain, shortness of breath, leg pain or other complaints today.  Shared decision-making irritation with patient regarding OB ultrasound.  I recommended this imaging study to rule out ectopic pregnancy however after vaginal laceration was found on full exam patient is requesting to not have ultrasound done.  I discussed with patient the concerns and possible lethal nature of ovarian torsion, ectopic pregnancy and she is understanding of this.  She states that she prefer to have vaginal laceration repaired and defer ultrasound.  She states that she will follow up with an OB/GYN.  I discussed this case with my attending physician who cosigned this note including patient's presenting symptoms, physical exam, and planned diagnostics and interventions. Attending physician stated agreement with plan or made changes to plan which were implemented.   Attending physician assessed patient at bedside.  Vaginal swabs show bacterial vaginosis.  Will treat with Flagyl.  Patient discharged with close follow-up with OB/GYN.  She will call tomorrow morning to make appointment.  She was given strict return precautions.  Patient was recommended to abstain from sex for 1 month to allow for laceration to heal.   The medical records were personally reviewed by myself. I personally reviewed all lab results and interpreted all imaging studies and either concurred with their official read or contacted radiology  for clarification.   This patient appears reasonably screened and I doubt any other medical condition requiring further workup, evaluation, or treatment in the ED at this time prior to discharge.   Patient's vitals are WNL apart from vital sign abnormalities discussed above, patient is in NAD, and able to ambulate in the ED at their baseline and able to tolerate PO.  Pain has been managed or a plan has been made for home management and has no complaints prior to discharge. Patient is comfortable with above plan and for discharge at this time. All questions were answered prior to disposition. Results from the ER workup discussed with the patient face to face and all questions answered to the best of my ability. The patient is safe for discharge with strict return precautions. Patient appears safe for discharge with appropriate follow-up. Conveyed my impression with the patient and they voiced understanding and are agreeable to plan.   An After Visit Summary was printed and given to the patient.  Portions of this note were generated with Scientist, clinical (histocompatibility and immunogenetics). Dictation errors may occur despite best attempts at proofreading.    Final Clinical Impression(s) / ED Diagnoses Final diagnoses:  Non-obstetric vaginal laceration without foreign body or perineal laceration, initial encounter  Bacterial vaginosis    Rx / DC Orders ED Discharge Orders         Ordered    metroNIDAZOLE (FLAGYL) 500 MG tablet  2 times daily     11/06/19 1418           Kamrar, [place],  PA 11/06/19 1626    Sabas SousBero, Michael M, MD 11/08/19 [phone]

## 2019-11-07 ENCOUNTER — Telehealth: Payer: Self-pay | Admitting: Family Medicine

## 2019-11-07 LAB — URINE CULTURE: Culture: <10000 — AB

## 2019-11-07 LAB — RPR
RPR Ser Ql: REACTIVE — AB
RPR Titer: 1:1 {titer}

## 2019-11-07 NOTE — Telephone Encounter (Signed)
The patient called in stating she took a pregnancy test yesterday and she would like to be seen today. She stated she has had some bleeding and she is concerned. The patient is not a patient with office. Informed the patient of the visiting the ER immediately. She stated she visited the ER yesterday but she does not trust their opinion, she would like to be seen by a provider at an office. Informed the patient of the 10-12 week schedule for new ob appointment. She stated she would like to be seen this week and would like to also have an ultrasound this week. She then stated she would like to call other offices to see if someone can get her in to do an ultrasound.

## 2019-11-08 LAB — T.PALLIDUM AB, TOTAL: T Pallidum Abs: NONREACTIVE

## 2019-11-08 LAB — GC/CHLAMYDIA PROBE AMP (~~LOC~~) NOT AT ARMC
Chlamydia: NEGATIVE
Neisseria Gonorrhea: NEGATIVE

## 2019-11-09 ENCOUNTER — Inpatient Hospital Stay (HOSPITAL_COMMUNITY)
Admission: AD | Admit: 2019-11-09 | Discharge: 2019-11-09 | Disposition: A | Discharge status: Home / Self Care | Payer: Medicaid Other | Attending: Obstetrics and Gynecology | Admitting: Obstetrics and Gynecology

## 2019-11-09 ENCOUNTER — Encounter (HOSPITAL_COMMUNITY): Payer: Self-pay | Admitting: Obstetrics and Gynecology

## 2019-11-09 ENCOUNTER — Inpatient Hospital Stay (HOSPITAL_COMMUNITY): Payer: Medicaid Other

## 2019-11-09 ENCOUNTER — Other Ambulatory Visit: Payer: Self-pay

## 2019-11-09 DIAGNOSIS — S3141XD Laceration without foreign body of vagina and vulva, subsequent encounter: Secondary | ICD-10-CM

## 2019-11-09 DIAGNOSIS — Z3A01 Less than 8 weeks gestation of pregnancy: Secondary | ICD-10-CM

## 2019-11-09 DIAGNOSIS — Z3A08 8 weeks gestation of pregnancy: Secondary | ICD-10-CM | POA: Diagnosis not present

## 2019-11-09 DIAGNOSIS — O3680X0 Pregnancy with inconclusive fetal viability, not applicable or unspecified: Secondary | ICD-10-CM

## 2019-11-09 DIAGNOSIS — O209 Hemorrhage in early pregnancy, unspecified: Secondary | ICD-10-CM | POA: Diagnosis not present

## 2019-11-09 DIAGNOSIS — O4691 Antepartum hemorrhage, unspecified, first trimester: Secondary | ICD-10-CM

## 2019-11-09 DIAGNOSIS — O4690 Antepartum hemorrhage, unspecified, unspecified trimester: Secondary | ICD-10-CM

## 2019-11-09 LAB — URINALYSIS, ROUTINE W REFLEX MICROSCOPIC
Bilirubin Urine: NEGATIVE
Glucose, UA: NEGATIVE mg/dL
Hgb urine dipstick: NEGATIVE
Ketones, ur: 20 mg/dL — AB
Nitrite: NEGATIVE
Protein, ur: NEGATIVE mg/dL
Specific Gravity, Urine: 1.029 (ref 1.005–1.030)
pH: 5 (ref 5.0–8.0)

## 2019-11-09 NOTE — MAU Note (Signed)
Patient was seen for vaginal tear a few days ago in the ED and found out she was pregnant.  States she received stitches for the tear and is having bleeding that she isn't sure if it is from the stitches or from her uterus.  When she went to the ED she had been passing blood clots-the PA stated he didn't believe the bleeding was from her uterus or cervix at that time and told her to follow up with an OB.

## 2019-11-09 NOTE — Discharge Instructions (Signed)
First Trimester of Pregnancy The first trimester of pregnancy is from week 1 until the end of week 13 (months 1 through 3). A week after a sperm fertilizes an egg, the egg will implant on the wall of the uterus. This embryo will begin to develop into a baby. Genes from you and your partner will form the baby. The female genes will determine whether the baby will be a boy or a girl. At 6-8 weeks, the eyes and face will be formed, and the heartbeat can be seen on ultrasound. At the end of 12 weeks, all the baby's organs will be formed. Now that you are pregnant, you will want to do everything you can to have a healthy baby. Two of the most important things are to get good prenatal care and to follow your health care provider's instructions. Prenatal care is all the medical care you receive before the baby's birth. This care will help prevent, find, and treat any problems during the pregnancy and childbirth. Body changes during your first trimester Your body goes through many changes during pregnancy. The changes vary from woman to woman.  You may gain or lose a couple of pounds at first.  You may feel sick to your stomach (nauseous) and you may throw up (vomit). If the vomiting is uncontrollable, call your health care provider.  You may tire easily.  You may develop headaches that can be relieved by medicines. All medicines should be approved by your health care provider.  You may urinate more often. Painful urination may mean you have a bladder infection.  You may develop heartburn as a result of your pregnancy.  You may develop constipation because certain hormones are causing the muscles that push stool through your intestines to slow down.  You may develop hemorrhoids or swollen veins (varicose veins).  Your breasts may begin to grow larger and become tender. Your nipples may stick out more, and the tissue that surrounds them (areola) may become darker.  Your gums may bleed and may be  sensitive to brushing and flossing.  Dark spots or blotches (chloasma, mask of pregnancy) may develop on your face. This will likely fade after the baby is born.  Your menstrual periods will stop.  You may have a loss of appetite.  You may develop cravings for certain kinds of food.  You may have changes in your emotions from day to day, such as being excited to be pregnant or being concerned that something may go wrong with the pregnancy and baby.  You may have more vivid and strange dreams.  You may have changes in your hair. These can include thickening of your hair, rapid growth, and changes in texture. Some women also have hair loss during or after pregnancy, or hair that feels dry or thin. Your hair will most likely return to normal after your baby is born. What to expect at prenatal visits During a routine prenatal visit:  You will be weighed to make sure you and the baby are growing normally.  Your blood pressure will be taken.  Your abdomen will be measured to track your baby's growth.  The fetal heartbeat will be listened to between weeks 10 and 14 of your pregnancy.  Test results from any previous visits will be discussed. Your health care provider may ask you:  How you are feeling.  If you are feeling the baby move.  If you have had any abnormal symptoms, such as leaking fluid, bleeding, severe headaches, or abdominal   cramping.  If you are using any tobacco products, including cigarettes, chewing tobacco, and electronic cigarettes.  If you have any questions. Other tests that may be performed during your first trimester include:  Blood tests to find your blood type and to check for the presence of any previous infections. The tests will also be used to check for low iron levels (anemia) and protein on red blood cells (Rh antibodies). Depending on your risk factors, or if you previously had diabetes during pregnancy, you may have tests to check for high blood sugar  that affects pregnant women (gestational diabetes).  Urine tests to check for infections, diabetes, or protein in the urine.  An ultrasound to confirm the proper growth and development of the baby.  Fetal screens for spinal cord problems (spina bifida) and Down syndrome.  HIV (human immunodeficiency virus) testing. Routine prenatal testing includes screening for HIV, unless you choose not to have this test.  You may need other tests to make sure you and the baby are doing well. Follow these instructions at home: Medicines  Follow your health care provider's instructions regarding medicine use. Specific medicines may be either safe or unsafe to take during pregnancy.  Take a prenatal vitamin that contains at least 600 micrograms (mcg) of folic acid.  If you develop constipation, try taking a stool softener if your health care provider approves. Eating and drinking   Eat a balanced diet that includes fresh fruits and vegetables, whole grains, good sources of protein such as meat, eggs, or tofu, and low-fat dairy. Your health care provider will help you determine the amount of weight gain that is right for you.  Avoid raw meat and uncooked cheese. These carry germs that can cause birth defects in the baby.  Eating four or five small meals rather than three large meals a day may help relieve nausea and vomiting. If you start to feel nauseous, eating a few soda crackers can be helpful. Drinking liquids between meals, instead of during meals, also seems to help ease nausea and vomiting.  Limit foods that are high in fat and processed sugars, such as fried and sweet foods.  To prevent constipation: ? Eat foods that are high in fiber, such as fresh fruits and vegetables, whole grains, and beans. ? Drink enough fluid to keep your urine clear or pale yellow. Activity  Exercise only as directed by your health care provider. Most women can continue their usual exercise routine during  pregnancy. Try to exercise for 30 minutes at least 5 days a week. Exercising will help you: ? Control your weight. ? Stay in shape. ? Be prepared for labor and delivery.  Experiencing pain or cramping in the lower abdomen or lower back is a good sign that you should stop exercising. Check with your health care provider before continuing with normal exercises.  Try to avoid standing for long periods of time. Move your legs often if you must stand in one place for a long time.  Avoid heavy lifting.  Wear low-heeled shoes and practice good posture.  You may continue to have sex unless your health care provider tells you not to. Relieving pain and discomfort  Wear a good support bra to relieve breast tenderness.  Take warm sitz baths to soothe any pain or discomfort caused by hemorrhoids. Use hemorrhoid cream if your health care provider approves.  Rest with your legs elevated if you have leg cramps or low back pain.  If you develop varicose veins in   your legs, wear support hose. Elevate your feet for 15 minutes, 3-4 times a day. Limit salt in your diet. Prenatal care  Schedule your prenatal visits by the twelfth week of pregnancy. They are usually scheduled monthly at first, then more often in the last 2 months before delivery.  Write down your questions. Take them to your prenatal visits.  Keep all your prenatal visits as told by your health care provider. This is important. Safety  Wear your seat belt at all times when driving.  Make a list of emergency phone numbers, including numbers for family, friends, the hospital, and police and fire departments. General instructions  Ask your health care provider for a referral to a local prenatal education class. Begin classes no later than the beginning of month 6 of your pregnancy.  Ask for help if you have counseling or nutritional needs during pregnancy. Your health care provider can offer advice or refer you to specialists for help  with various needs.  Do not use hot tubs, steam rooms, or saunas.  Do not douche or use tampons or scented sanitary pads.  Do not cross your legs for long periods of time.  Avoid cat litter boxes and soil used by cats. These carry germs that can cause birth defects in the baby and possibly loss of the fetus by miscarriage or stillbirth.  Avoid all smoking, herbs, alcohol, and medicines not prescribed by your health care provider. Chemicals in these products affect the formation and growth of the baby.  Do not use any products that contain nicotine or tobacco, such as cigarettes and e-cigarettes. If you need help quitting, ask your health care provider. You may receive counseling support and other resources to help you quit.  Schedule a dentist appointment. At home, brush your teeth with a soft toothbrush and be gentle when you floss. Contact a health care provider if:  You have dizziness.  You have mild pelvic cramps, pelvic pressure, or nagging pain in the abdominal area.  You have persistent nausea, vomiting, or diarrhea.  You have a bad smelling vaginal discharge.  You have pain when you urinate.  You notice increased swelling in your face, hands, legs, or ankles.  You are exposed to fifth disease or chickenpox.  You are exposed to [place] measles (rubella) and have never had it. Get help right away if:  You have a fever.  You are leaking fluid from your vagina.  You have spotting or bleeding from your vagina.  You have severe abdominal cramping or pain.  You have rapid weight gain or loss.  You vomit blood or material that looks like coffee grounds.  You develop a severe headache.  You have shortness of breath.  You have any kind of trauma, such as from a fall or a car accident. Summary  The first trimester of pregnancy is from week 1 until the end of week 13 (months 1 through 3).  Your body goes through many changes during pregnancy. The changes vary from  woman to woman.  You will have routine prenatal visits. During those visits, your health care provider will examine you, discuss any test results you may have, and talk with you about how you are feeling. This information is not intended to replace advice given to you by your health care provider. Make sure you discuss any questions you have with your health care provider. Document Revised: 07/10/2017 Document Reviewed: 07/09/2016 Elsevier Patient Education  2020 [employer] [facility] Prenatal Care Providers  Center for [facility]       Phone: [phone]  Center for [place] [facility] [place] Phone: [place] for [place] at [place]  Phone: [place] for [place] at [facility]  Phone: [place] for [place] at [place]  Phone: [place] for [place] at [place]                  Phone: [facility]       Phone: [phone]  [facility]    Phone: [phone]   [facility])    Phone: [facility]    Phone: [phone]  [facility]    Phone: [place] [facility]  Phone: [place]    Phone: [phone]  [facility] [place]   Phone: [phone]  [facility]    Phone: [phone]

## 2019-11-09 NOTE — MAU Provider Note (Signed)
History     CSN: [id number]  Arrival date and time: 11/09/19 2023   None     Chief Complaint  Patient presents with  . Vaginal Bleeding   Kaybree Williams is a 24 y.o. G1P0 at [redacted]w[redacted]d by Definite LMP of Sep 09, 2019.  However, patient was on birth control pills during time of conception. She presents today for Vaginal Bleeding in setting of known vaginal laceration.  She states the bleeding was initially light red to pink upon waking, but was pale throughout the day.  Patient states that it is has not been as heavy as when she was evaluated and treated on Sunday for her laceration.  Patient denies cramping.  Patient reports she has been taking metronidazole for a BV diagnosis from Sunday November 07, 2019.     OB History    Gravida  1   Para      Term      Preterm      AB      Living        SAB      TAB      Ectopic      Multiple      Live Births              Past Medical History:  Diagnosis Date  . GERD (gastroesophageal reflux disease)     No past surgical history on file.  No family history on file.  Social History   Tobacco Use  . Smoking status: Never Smoker  Substance Use Topics  . Alcohol use: No  . Drug use: No    Allergies: No Known Allergies  Medications Prior to Admission  Medication Sig Dispense Refill Last Dose  . metroNIDAZOLE (FLAGYL) 500 MG tablet Take 1 tablet (500 mg total) by mouth 2 (two) times daily. 14 tablet 0 11/09/2019 at Unknown time  . Prenatal Vit-Fe Fumarate-FA (PRENATAL MULTIVITAMIN) TABS tablet Take 1 tablet by mouth daily at 12 noon.   11/09/2019 at Unknown time    Review of Systems  Constitutional: Negative for chills and fever.  Respiratory: Negative for cough and shortness of breath.   Gastrointestinal: Negative for abdominal pain, nausea and vomiting.  Genitourinary: Positive for dysuria (Burning when in contact with laceration. ) and vaginal bleeding. Negative for difficulty urinating and vaginal discharge.   Neurological: Negative for dizziness, light-headedness and headaches.   Physical Exam   Last menstrual period 09/09/2019.  Physical Exam  Constitutional: She is oriented to person, place, and time. She appears well-developed and well-nourished.  Obese  HENT:  Head: Normocephalic and atraumatic.  Eyes: Conjunctivae are normal.  Cardiovascular: Normal rate, regular rhythm and normal heart sounds.  Respiratory: Effort normal.  GI: Soft. There is no abdominal tenderness.  Genitourinary: Cervix exhibits no motion tenderness and no discharge.    Vaginal discharge (Thin white) present.     No vaginal bleeding.  No bleeding in the vagina.    No foreign body in the vagina.     Genitourinary Comments: Right labial laceration repaired with 3 interrupted sutures.  Well approximated.  Granulation present. No active bleeding or erythema noted.   BME: Difficult to assess uterine size d/t body habitus.    Musculoskeletal:        General: Normal range of motion.     Cervical back: Normal range of motion.  Neurological: She is alert and oriented to person, place, and time.  Skin: Skin is warm and dry.  Psychiatric: She has a normal  mood and affect. Her behavior is normal.    MAU Course  Procedures Results for orders placed or performed during the hospital encounter of 11/09/19 (from the past 24 hour(s))  Urinalysis, Routine w reflex microscopic     Status: Abnormal   Collection Time: 11/09/19  9:00 PM  Result Value Ref Range   Color, Urine YELLOW YELLOW   APPearance HAZY (A) CLEAR   Specific Gravity, Urine 1.029 1.005 - 1.030   pH 5.0 5.0 - 8.0   Glucose, UA NEGATIVE NEGATIVE mg/dL   Hgb urine dipstick NEGATIVE NEGATIVE   Bilirubin Urine NEGATIVE NEGATIVE   Ketones, ur 20 (A) NEGATIVE mg/dL   Protein, ur NEGATIVE NEGATIVE mg/dL   Nitrite NEGATIVE NEGATIVE   Leukocytes,Ua SMALL (A) NEGATIVE   RBC / HPF 0-5 0 - 5 RBC/hpf   WBC, UA 6-10 0 - 5 WBC/hpf   Bacteria, UA RARE (A) NONE SEEN    Squamous Epithelial / LPF 11-20 0 - 5   Mucus PRESENT    US OB Transvaginal  Result Date: 11/09/2019 CLINICAL DATA:  Cramping and bleeding EXAM: TRANSVAGINAL OB ULTRASOUND TECHNIQUE: Transvaginal ultrasound was performed for complete evaluation of the gestation as well as the maternal uterus, adnexal regions, and pelvic cul-de-sac. COMPARISON:  None. FINDINGS: Intrauterine gestational sac: Single Yolk sac:  Visualized. Embryo:  Visualized. Cardiac Activity: Visualized. Heart Rate: 108 bpm CRL: 4.54 mm   6 w 1 d                  [place] EDC: 07/03/2020 Subchorionic hemorrhage:  None visualized. Maternal uterus/adnexae: Right ovary within normal limits and measures 2.8 x 1.4 x 1.6 cm. The left ovary measures 3.2 x 3 x 3.2 cm. Septated and slightly reticular appearing cyst in the left ovary measuring 3 cm, probably a hemorrhagic cyst. No significant free fluid. IMPRESSION: Single viable intrauterine pregnancy as above. Electronically Signed   By: Donavan Foil M.D.   On: 11/09/2019 22:18    MDM Physical exam Labs: UA BSUS Formal US Assessment and Plan  24 year old G1P0 at 8.5 weeks Vaginal Bleeding  -Reviewed POC with patient. -Exam performed and findings discussed.  -BSUS completed with uterus identified, but no IUP identified. -Will send for formal US to confirm dates.   Maryann Conners 11/09/2019, 9:37 PM   Reassessment (10:40 PM) SIUP at 6.1 weeks  -[place] returns as above. -Results discussed with patient. -Informed of change in EDD. To November 23,2021. -Encouraged to initiate care.  -Patient requests and given information for providers in the community. -Bleeding precautions given. -Encouraged to call or return to MAU if symptoms worsen or with the onset of new symptoms. -Discharged to home in stable condition.  Maryann Conners MSN, CNM Advanced Practice Provider, Center for [employer] [facility]

## 2019-12-20 ENCOUNTER — Other Ambulatory Visit: Payer: Self-pay

## 2019-12-20 ENCOUNTER — Ambulatory Visit (INDEPENDENT_AMBULATORY_CARE_PROVIDER_SITE_OTHER): Payer: Medicaid Other | Admitting: *Deleted

## 2019-12-20 VITALS — BP 134/89 | HR 83 | Temp 98.6°F | Wt 271.8 lb

## 2019-12-20 DIAGNOSIS — O480 Post-term pregnancy: Secondary | ICD-10-CM | POA: Insufficient documentation

## 2019-12-20 DIAGNOSIS — Z3A12 12 weeks gestation of pregnancy: Secondary | ICD-10-CM

## 2019-12-20 DIAGNOSIS — Z3400 Encounter for supervision of normal first pregnancy, unspecified trimester: Secondary | ICD-10-CM

## 2019-12-20 DIAGNOSIS — Z3401 Encounter for supervision of normal first pregnancy, first trimester: Secondary | ICD-10-CM

## 2019-12-20 MED ORDER — BLOOD PRESSURE MONITOR AUTOMAT DEVI: 1.0000 | Freq: Every day | 0 refills | Status: DC

## 2019-12-20 MED ORDER — GOJJI WEIGHT SCALE MISC: 1.0000 | Freq: Every day | 0 refills | Status: DC | PRN

## 2019-12-20 NOTE — Patient Instructions (Addendum)
Genetic Screening Results Information: You are having genetic testing called [facility] today.  It will take approximately 2 weeks before the results are available.  To get your results, you need Internet access to a web browser to search [facility]/MyChart (the direct app on your phone will not give you these results).  Then select Lab Scanned and click on the blue hyper link that says View Image to see your [facility] results.  You can also use the directions on the purple card given to look up your results directly on the [facility] website.   First Trimester of Pregnancy  The first trimester of pregnancy is from week 1 until the end of week 13 (months 1 through 3). During this time, your baby will begin to develop inside you. At 6-8 weeks, the eyes and face are formed, and the heartbeat can be seen on ultrasound. At the end of 12 weeks, all the baby's organs are formed. Prenatal care is all the medical care you receive before the birth of your baby. Make sure you get good prenatal care and follow all of your doctor's instructions. Follow these instructions at home: Medicines  Take over-the-counter and prescription medicines only as told by your doctor. Some medicines are safe and some medicines are not safe during pregnancy.  Take a prenatal vitamin that contains at least 600 micrograms (mcg) of folic acid.  If you have trouble pooping (constipation), take medicine that will make your stool soft (stool softener) if your doctor approves. Eating and drinking   Eat regular, healthy meals.  Your doctor will tell you the amount of weight gain that is right for you.  Avoid raw meat and uncooked cheese.  If you feel sick to your stomach (nauseous) or throw up (vomit): ? Eat 4 or 5 small meals a day instead of 3 large meals. ? Try eating a few soda crackers. ? Drink liquids between meals instead of during meals.  To prevent constipation: ? Eat foods that are high in fiber, like fresh fruits and  vegetables, whole grains, and beans. ? Drink enough fluids to keep your pee (urine) clear or pale yellow. Activity  Exercise only as told by your doctor. Stop exercising if you have cramps or pain in your lower belly (abdomen) or low back.  Do not exercise if it is too hot, too humid, or if you are in a place of great height (high altitude).  Try to avoid standing for long periods of time. Move your legs often if you must stand in one place for a long time.  Avoid heavy lifting.  Wear low-heeled shoes. Sit and stand up straight.  You can have sex unless your doctor tells you not to. Relieving pain and discomfort  Wear a good support bra if your breasts are sore.  Take warm water baths (sitz baths) to soothe pain or discomfort caused by hemorrhoids. Use hemorrhoid cream if your doctor says it is okay.  Rest with your legs raised if you have leg cramps or low back pain.  If you have puffy, bulging veins (varicose veins) in your legs: ? Wear support hose or compression stockings as told by your doctor. ? Raise (elevate) your feet for 15 minutes, 3-4 times a day. ? Limit salt in your food. Prenatal care  Schedule your prenatal visits by the twelfth week of pregnancy.  Write down your questions. Take them to your prenatal visits.  Keep all your prenatal visits as told by your doctor. This is important.  Safety  Wear your seat belt at all times when driving.  Make a list of emergency phone numbers. The list should include numbers for family, friends, the hospital, and police and fire departments. General instructions  Ask your doctor for a referral to a local prenatal class. Begin classes no later than at the start of month 6 of your pregnancy.  Ask for help if you need counseling or if you need help with nutrition. Your doctor can give you advice or tell you where to go for help.  Do not use hot tubs, steam rooms, or saunas.  Do not douche or use tampons or scented sanitary  pads.  Do not cross your legs for long periods of time.  Avoid all herbs and alcohol. Avoid drugs that are not approved by your doctor.  Do not use any tobacco products, including cigarettes, chewing tobacco, and electronic cigarettes. If you need help quitting, ask your doctor. You may get counseling or other support to help you quit.  Avoid cat litter boxes and soil used by cats. These carry germs that can cause birth defects in the baby and can cause a loss of your baby (miscarriage) or stillbirth.  Visit your dentist. At home, brush your teeth with a soft toothbrush. Be gentle when you floss. Contact a doctor if:  You are dizzy.  You have mild cramps or pressure in your lower belly.  You have a nagging pain in your belly area.  You continue to feel sick to your stomach, you throw up, or you have watery poop (diarrhea).  You have a bad smelling fluid coming from your vagina.  You have pain when you pee (urinate).  You have increased puffiness (swelling) in your face, hands, legs, or ankles. Get help right away if:  You have a fever.  You are leaking fluid from your vagina.  You have spotting or bleeding from your vagina.  You have very bad belly cramping or pain.  You gain or lose weight rapidly.  You throw up blood. It may look like coffee grounds.  You are around people who have [place] measles, fifth disease, or chickenpox.  You have a very bad headache.  You have shortness of breath.  You have any kind of trauma, such as from a fall or a car accident. Summary  The first trimester of pregnancy is from week 1 until the end of week 13 (months 1 through 3).  To take care of yourself and your unborn baby, you will need to eat healthy meals, take medicines only if your doctor tells you to do so, and do activities that are safe for you and your baby.  Keep all follow-up visits as told by your doctor. This is important as your doctor will have to ensure that your  baby is healthy and growing well. This information is not intended to replace advice given to you by your health care provider. Make sure you discuss any questions you have with your health care provider. Document Revised: 11/18/2018 Document Reviewed: 08/05/2016 Elsevier Patient Education  2020 [employer].  Warning Signs During Pregnancy A pregnancy lasts about 40 weeks, starting from the first day of your last period until the baby is born. Pregnancy is divided into three phases called trimesters.  The first trimester refers to week 1 through week 13 of pregnancy.  The second trimester is the start of week 14 through the end of week 27.  The third trimester is the start of week 28  until you deliver your baby. During each trimester of pregnancy, certain signs and symptoms may indicate a problem. Talk with your health care provider about your current health and any medical conditions you have. Make sure you know the symptoms that you should watch for and report. How does this affect me?  Warning signs in the first trimester While some changes during the first trimester may be uncomfortable, most do not represent a serious problem. Let your health care provider know if you have any of the following warning signs in the first trimester:  You cannot eat or drink without vomiting, and this lasts for longer than a day.  You have vaginal bleeding or spotting along with menstrual-like cramping.  You have diarrhea for longer than a day.  You have a fever or other signs of infection, such as: ? Pain or burning when you urinate. ? Foul smelling or thick or yellowish vaginal discharge. Warning signs in the second trimester As your baby grows and changes during the second trimester, there are additional signs and symptoms that may indicate a problem. These include:  Signs and symptoms of infection, including a fever.  Signs or symptoms of a miscarriage or preterm labor, such as regular  contractions, menstrual-like cramping, or lower abdominal pain.  Bloody or watery vaginal discharge or obvious vaginal bleeding.  Feeling like your heart is pounding.  Having trouble breathing.  Nausea, vomiting, or diarrhea that lasts for longer than a day.  Craving non-food items, such as clay, chalk, or dirt. This may be a sign of a very treatable medical condition called pica. Later in your second trimester, watch for signs and symptoms of a serious medical condition called preeclampsia.These include:  Changes in your vision.  A severe headache that does not go away.  Nausea and vomiting. It is also important to notice if your baby stops moving or moves less than usual during this time. Warning signs in the third trimester As you approach the third trimester, your baby is growing and your body is preparing for the birth of your baby. In your third trimester, be sure to let your health care provider know if:  You have signs and symptoms of infection, including a fever.  You have vaginal bleeding.  You notice that your baby is moving less than usual or is not moving.  You have nausea, vomiting, or diarrhea that lasts for longer than a day.  You have a severe headache that does not go away.  You have vision changes, including seeing spots or having blurry or double vision.  You have increased swelling in your hands or face. How does this affect my baby? Throughout your pregnancy, always report any of the warning signs of a problem to your health care provider. This can help prevent complications that may affect your baby, including:  Increased risk for premature birth.  Infection that may be transmitted to your baby.  Increased risk for stillbirth. Contact a health care provider if:  You have any of the warning signs of a problem for the current trimester of your pregnancy.  Any of the following apply to you during any trimester of pregnancy: ? You have strong  emotions, such as sadness or anxiety, that interfere with work or personal relationships. ? You feel unsafe in your home and need help finding a safe place to live. ? You are using tobacco products, alcohol, or drugs and you need help to stop. Get help right away if: You have signs or  symptoms of labor before 37 weeks of pregnancy. These include:  Contractions that are 5 minutes or less apart, or that increase in frequency, intensity, or length.  Sudden, sharp abdominal pain or low back pain.  Uncontrolled gush or trickle of fluid from your vagina. Summary  A pregnancy lasts about 40 weeks, starting from the first day of your last period until the baby is born. Pregnancy is divided into three phases called trimesters. Each trimester has warning signs to watch for.  Always report any warning signs to your health care provider in order to prevent complications that may affect both you and your baby.  Talk with your health care provider about your current health and any medical conditions you have. Make sure you know the symptoms that you should watch for and report. This information is not intended to replace advice given to you by your health care provider. Make sure you discuss any questions you have with your health care provider. Document Revised: 11/16/2018 Document Reviewed: 05/14/2017 Elsevier Patient Education  2020 [employer].  Genetic Testing During Pregnancy Genetic testing during pregnancy is also called prenatal genetic testing. This type of testing can determine if your baby is at risk of being born with a disorder caused by abnormal genes or chromosomes (genetic disorder). Chromosomes contain genes that control how your baby will develop in your womb. There are many different genetic disorders. Examples of genetic disorders that may be found through genetic testing include Down syndrome and cystic fibrosis. Gene changes (mutations) can be passed down through families. Genetic  testing is offered to all women before or during pregnancy. You can choose whether to have genetic testing. Why is genetic testing done? Genetic testing is done during pregnancy to find out whether your child is at risk for a genetic disorder. Having genetic testing allows you to:  Discuss your test results and options with a genetic counselor.  Prepare for a baby that may be born with a genetic disorder. Learning about the disorder ahead of time helps you be better prepared to manage it. Your health care providers can also be prepared in case your baby requires special care before or after birth.  Consider whether you want to continue with the pregnancy. In some cases, genetic testing may be done to learn about the traits a child will inherit. Types of genetic tests There are two basic types of genetic testing. Screening tests indicate whether your developing baby (fetus) is at higher risk for a genetic disorder. Diagnostic tests check actual fetal cells to diagnose a genetic disorder. Screening tests     Screening tests will not harm your baby. They are recommended for all pregnant women. Types of screening tests include:  Carrier screening. This test involves checking genes from both parents by testing their blood or saliva. The test checks to find out if the parents carry a genetic mutation that may be passed to a baby. In most cases, both parents must carry the mutation for a baby to be at risk.  First trimester screening. This test combines a blood test with sound wave imaging of your baby (fetal ultrasound). This screening test checks for a risk of Down syndrome or other defects caused by having extra chromosomes. It also checks for defects of the heart, abdomen, or skeleton.  Second trimester screening also combines a blood test with a fetal ultrasound exam. It checks for a risk of genetic defects of the face, brain, spine, heart, or limbs.  Combined or sequential  screening. This type  of testing combines the results of first and second trimester screening. This type of testing may be more accurate than first or second trimester screening alone.  Cell-free DNA testing. This is a blood test that detects cells released by the placenta that get into the mother's blood. It can be used to check for a risk of Down syndrome, other extra chromosome syndromes, and disorders caused by abnormal numbers of sex chromosomes. This test can be done any time after 10 weeks of pregnancy.  Diagnostic tests Diagnostic tests carry slight risks of problems, including bleeding, infection, and loss of the pregnancy. These tests are done only if your baby is at risk for a genetic disorder. You may meet with a genetic counselor to discuss the risks and benefits before having diagnostic tests. Examples of diagnostic tests include:  Chorionic villus sampling (CVS). This involves a procedure to remove and test a sample of cells taken from the placenta. The procedure may be done between 10 and 12 weeks of pregnancy.  Amniocentesis. This involves a procedure to remove and test a sample of fluid (amniotic fluid) and cells from the sac that surrounds the developing baby. The procedure may be done between 15 and 20 weeks of pregnancy. What do the results mean? For a screening test:  If the results are negative, it often means that your child is not at higher risk. There is still a slight chance your child could have a genetic disorder.  If the results are positive, it does not mean your child will have a genetic disorder. It may mean that your child has a higher-than-normal risk for a genetic disorder. In that case, you may want to talk with a genetic counselor about whether you should have diagnostic genetic tests. For a diagnostic test:  If the result is negative, it is unlikely that your child will have a genetic disorder.  If the test is positive for a genetic disorder, it is likely that your child will  have the disorder. The test may not tell how severe the disorder will be. Talk with your health care provider about your options. Questions to ask your health care provider Before talking to your health care provider about genetic testing, find out if there is a history of genetic disorders in your family. It may also help to know your family's ethnic origins. Then ask your health care provider the following questions:  Is my baby at risk for a genetic disorder?  What are the benefits of having genetic screening?  What tests are best for me and my baby?  What are the risks of each test?  If I get a positive result on a screening test, what is the next step?  Should I meet with a genetic counselor before having a diagnostic test?  Should my partner or other members of my family be tested?  How much do the tests cost? Will my insurance cover the testing? Summary  Genetic testing is done during pregnancy to find out whether your child is at risk for a genetic disorder.  Genetic testing is offered to all women before or during pregnancy. You can choose whether to have genetic testing.  There are two basic types of genetic testing. Screening tests indicate whether your developing baby (fetus) is at higher risk for a genetic disorder. Diagnostic tests check actual fetal cells to diagnose a genetic disorder.  If a diagnostic genetic test is positive, talk with your health care provider about your  options. This information is not intended to replace advice given to you by your health care provider. Make sure you discuss any questions you have with your health care provider. Document Revised: 11/18/2018 Document Reviewed: 10/12/2017 Elsevier Patient Education  2020 [employer] Inc.  Hypertension During Pregnancy Hypertension is also called high blood pressure. High blood pressure means that the force of your blood moving in your body is too strong. It can cause problems for you and your baby.  Different types of high blood pressure can happen during pregnancy. The types are:  High blood pressure before you got pregnant. This is called chronic hypertension.  This can continue during your pregnancy. Your doctor will want to keep checking your blood pressure. You may need medicine to keep your blood pressure under control while you are pregnant. You will need follow-up visits after you have your baby.  High blood pressure that goes up during pregnancy when it was normal before. This is called gestational hypertension. It will usually get better after you have your baby, but your doctor will need to watch your blood pressure to make sure that it is getting better.  Very high blood pressure during pregnancy. This is called preeclampsia. Very high blood pressure is an emergency that needs to be checked and treated right away.  You may develop very high blood pressure after giving birth. This is called postpartum preeclampsia. This usually occurs within 48 hours after childbirth but may occur up to 6 weeks after giving birth. This is rare. How does this affect me? If you have high blood pressure during pregnancy, you have a higher chance of developing high blood pressure:  As you get older.  If you get pregnant again. In some cases, high blood pressure during pregnancy can cause:  Stroke.  Heart attack.  Damage to the kidneys, lungs, or liver.  Preeclampsia.  Jerky movements you cannot control (convulsions or seizures).  Problems with the placenta. How does this affect my baby? Your baby may:  Be born early.  Not weigh as much as he or she should.  Not handle labor well, leading to a c-section birth. What are the risks?  Having high blood pressure during a past pregnancy.  Being overweight.  Being 71 years old or older.  Being pregnant for the first time.  Being pregnant with more than one baby.  Becoming pregnant using fertility methods, such as IVF.  Having  other problems, such as diabetes, or kidney disease.  Having family members who have high blood pressure. What can I do to lower my risk?   Keep a healthy weight.  Eat a healthy diet.  Follow what your doctor tells you about treating any medical problems that you had before becoming pregnant. It is very important to go to all of your doctor visits. Your doctor will check your blood pressure and make sure that your pregnancy is progressing as it should. Treatment should start early if a problem is found. How is this treated? Treatment for high blood pressure during pregnancy can differ depending on the type of high blood pressure you have and how serious it is.  You may need to take blood pressure medicine.  If you have been taking medicine for your blood pressure, you may need to change the medicine during pregnancy if it is not safe for your baby.  If your doctor thinks that you could get very high blood pressure, he or she may tell you to take a low-dose aspirin during your  pregnancy.  If you have very high blood pressure, you may need to stay in the hospital so you and your baby can be watched closely. You may also need to take medicine to lower your blood pressure. This medicine may be given by mouth or through an IV tube.  In some cases, if your condition gets worse, you may need to have your baby early. Follow these instructions at home: Eating and drinking   Drink enough fluid to keep your pee (urine) pale yellow.  Avoid caffeine. Lifestyle  Do not use any products that contain nicotine or tobacco, such as cigarettes, e-cigarettes, and chewing tobacco. If you need help quitting, ask your doctor.  Do not use alcohol or drugs.  Avoid stress.  Rest and get plenty of sleep.  Regular exercise can help. Ask your doctor what kinds of exercise are best for you. General instructions  Take over-the-counter and prescription medicines only as told by your doctor.  Keep all  prenatal and follow-up visits as told by your doctor. This is important. Contact a doctor if:  You have symptoms that your doctor told you to watch for, such as: ? Headaches. ? Nausea. ? Vomiting. ? Belly (abdominal) pain. ? Dizziness. ? Light-headedness. Get help right away if:  You have: ? Very bad belly pain that does not get better with treatment. ? A very bad headache that does not get better. ? Vomiting that does not get better. ? Sudden, fast weight gain. ? Sudden swelling in your hands, ankles, or face. ? Bleeding from your vagina. ? Blood in your pee. ? Blurry vision. ? Double vision. ? Shortness of breath. ? Chest pain. ? Weakness on one side of your body. ? Trouble talking.  Your baby is not moving as much as usual. Summary  High blood pressure is also called hypertension.  High blood pressure means that the force of your blood moving in your body is too strong.  High blood pressure can cause problems for you and your baby.  Keep all follow-up visits as told by your doctor. This is important. This information is not intended to replace advice given to you by your health care provider. Make sure you discuss any questions you have with your health care provider. Document Revised: 11/18/2018 Document Reviewed: 08/24/2018 Elsevier Patient Education  2020 [employer].

## 2019-12-20 NOTE — Progress Notes (Signed)
   PRENATAL INTAKE SUMMARY  Ms. Ennis presents today New OB Nurse Interview.  OB History    Gravida  1   Para      Term      Preterm      AB      Living        SAB      TAB      Ectopic      Multiple      Live Births             I have reviewed the patient's medical, obstetrical, social, and family histories, medications, and available lab results.  SUBJECTIVE She has no unusual complaints.  OBJECTIVE Initial nurse interview for history/labs (New OB).  EDD: 07/03/2020 by early ultrasound GA: [redacted]w[redacted]d FHT: 161 G1P0  GENERAL APPEARANCE: alert, well appearing, in no apparent distress, oriented to person, place and time.   ASSESSMENT Normal pregnancy  PLAN Prenatal care-[facility] P[facility]/HIV  OB Urine Culture GC/CT/PAP at next visit with Judeth Horn, NP 01/06/20 HgbEval/SMA/CF ([facility]) [facility] A1C Glucose  Rx for BP monitor and weight scale sent to [facility] Continue PNV Patient to signup for Babyscripts  Clovis Pu, RN

## 2019-12-21 LAB — OBSTETRIC PANEL, INCLUDING HIV
Antibody Screen: NEGATIVE
Basophils Absolute: 0 10*3/uL (ref 0.0–0.2)
Basos: 0 %
EOS (ABSOLUTE): 0.1 10*3/uL (ref 0.0–0.4)
Eos: 1 %
HIV Screen 4th Generation wRfx: NONREACTIVE
Hematocrit: 32.7 % — ABNORMAL LOW (ref 34.0–46.6)
Hemoglobin: 11.7 g/dL (ref 11.1–15.9)
Hepatitis B Surface Ag: NEGATIVE
Immature Grans (Abs): 0 10*3/uL (ref 0.0–0.1)
Immature Granulocytes: 0 %
Lymphocytes Absolute: 2.2 10*3/uL (ref 0.7–3.1)
Lymphs: 23 %
MCH: 29 pg (ref 26.6–33.0)
MCHC: 35.8 g/dL — ABNORMAL HIGH (ref 31.5–35.7)
MCV: 81 fL (ref 79–97)
Monocytes Absolute: 0.9 10*3/uL (ref 0.1–0.9)
Monocytes: 9 %
Neutrophils Absolute: 6.6 10*3/uL (ref 1.4–7.0)
Neutrophils: 67 %
Platelets: 387 10*3/uL (ref 150–450)
RBC: 4.03 x10E6/uL (ref 3.77–5.28)
RDW: 13.1 % (ref 11.7–15.4)
RPR Ser Ql: NONREACTIVE
Rh Factor: POSITIVE
Rubella Antibodies, IGG: 2.4 index (ref >0.99)
WBC: 9.9 10*3/uL (ref 3.4–10.8)

## 2019-12-21 LAB — GLUCOSE, RANDOM: Glucose: 95 mg/dL (ref 65–99)

## 2019-12-21 LAB — HEMOGLOBIN A1C
Est. average glucose Bld gHb Est-mCnc: 111 mg/dL
Hgb A1c MFr Bld: 5.5 % (ref 4.8–5.6)

## 2019-12-21 LAB — HEPATITIS C ANTIBODY: Hep C Virus Ab: <0.1 s/co ratio (ref 0.0–0.9)

## 2019-12-22 ENCOUNTER — Encounter: Payer: Self-pay | Admitting: General Practice

## 2019-12-22 LAB — URINE CULTURE, OB REFLEX

## 2019-12-22 LAB — CULTURE, OB URINE

## 2019-12-28 ENCOUNTER — Encounter: Payer: Self-pay | Admitting: General Practice

## 2019-12-30 ENCOUNTER — Encounter: Payer: Self-pay | Admitting: General Practice

## 2020-01-06 ENCOUNTER — Other Ambulatory Visit: Payer: Self-pay

## 2020-01-06 ENCOUNTER — Encounter: Payer: Self-pay | Admitting: General Practice

## 2020-01-06 ENCOUNTER — Ambulatory Visit (INDEPENDENT_AMBULATORY_CARE_PROVIDER_SITE_OTHER): Payer: Medicaid Other | Admitting: Student

## 2020-01-06 ENCOUNTER — Other Ambulatory Visit (HOSPITAL_COMMUNITY)
Admission: RE | Admit: 2020-01-06 | Discharge: 2020-01-06 | Disposition: A | Discharge status: Home / Self Care | Payer: Medicaid Other | Source: Ambulatory Visit | Attending: Student | Admitting: Student

## 2020-01-06 DIAGNOSIS — Z3400 Encounter for supervision of normal first pregnancy, unspecified trimester: Secondary | ICD-10-CM | POA: Insufficient documentation

## 2020-01-06 DIAGNOSIS — O99212 Obesity complicating pregnancy, second trimester: Secondary | ICD-10-CM | POA: Diagnosis not present

## 2020-01-06 DIAGNOSIS — O99213 Obesity complicating pregnancy, third trimester: Secondary | ICD-10-CM | POA: Insufficient documentation

## 2020-01-06 DIAGNOSIS — E6609 Other obesity due to excess calories: Secondary | ICD-10-CM | POA: Diagnosis not present

## 2020-01-06 DIAGNOSIS — Z3A14 14 weeks gestation of pregnancy: Secondary | ICD-10-CM | POA: Diagnosis not present

## 2020-01-06 MED ORDER — ASPIRIN EC 81 MG PO TBEC: 81.0000 mg | DELAYED_RELEASE_TABLET | Freq: Every day | ORAL | 5 refills | Status: DC

## 2020-01-06 NOTE — Patient Instructions (Addendum)
Childbirth Education Options: [place] Classes:  Childbirth education classes can help you get ready for a positive parenting experience. You can also meet other expectant parents and get free stuff for your baby. Each class runs for five weeks on the same night and costs $45 for the mother-to-be and her support person. Medicaid covers the cost if you are eligible. Call [phone] to register. [facility] Childbirth Education:  [phone] or [phone] or [email or web address]  Baby & Me Class: Discuss newborn & infant parenting and family adjustment issues with other new mothers in a relaxed environment. Each week brings a new speaker or baby-centered activity. We encourage new mothers to join us every Thursday at 11:00am. Babies birth until crawling. No registration or fee. Daddy Boot Camp: This course offers Dads-to-be the tools and knowledge needed to feel confident on their journey to becoming new fathers. Experienced dads, who have been trained as coaches, teach dads-to-be how to hold, comfort, diaper, swaddle and play with their infant while being able to support the new mom as well. A class for men taught by men. $25/dad Big Brother/Big Sister: Let your children share in the joy of a new brother or sister in this special class designed just for them. Class includes discussion about how families care for babies: swaddling, holding, diapering, safety as well as how they can be helpful in their new role. This class is designed for children ages 2 to 6, but any age is welcome. Please register each child individually. $5/child  Mom Talk: This mom-led group offers support and connection to mothers as they journey through the adjustments and struggles of that sometimes overwhelming first year after the birth of a child. Tuesdays at 10:00am and Thursdays at 6:00pm. Babies welcome. No registration or fee. Breastfeeding Support Group: This group is a mother-to-mother  support circle where moms have the opportunity to share their breastfeeding experiences. A Lactation Consultant is present for questions and concerns. Meets each Tuesday at 11:00am. No fee or registration. Breastfeeding Your Baby: Learn what to expect in the first days of breastfeeding your newborn.  This class will help you feel more confident with the skills needed to begin your breastfeeding experience. Many new mothers are concerned about breastfeeding after leaving the hospital. This class will also address the most common fears and challenges about breastfeeding during the first few weeks, months and beyond. (call for fee) Comfort Techniques and Tour: This 2 hour interactive class will provide you the opportunity to learn & practice hands-on techniques that can help relieve some of the discomfort of labor and encourage your baby to rotate toward the best position for birth. You and your partner will be able to try a variety of labor positions with birth balls and rebozos as well as practice breathing, relaxation, and visualization techniques. A tour of the [facility] is included with this class. $20 per registrant and support person Childbirth Class- Weekend Option: This class is a Weekend version of our Birth & Baby series. It is designed for parents who have a difficult time fitting several weeks of classes into their schedule. It covers the care of your newborn and the basics of labor and childbirth. It also includes a Maternity Care Center Tour of [facility] and lunch. The class is held two consecutive days: beginning on Friday evening from 6:30 - 8:30 p.m. and the next day, Saturday from 9 a.m. - 4 p.m. (call for fee) Waterbirth Class: Interested in a waterbirth?  This   informational class will help you discover whether waterbirth is the right fit for you. Education about waterbirth itself, supplies you would need and how to assemble your support team is what you can  expect from this class. Some obstetrical practices require this class in order to pursue a waterbirth. (Not all obstetrical practices offer waterbirth-check with your healthcare provider.) Register only the expectant mom, but you are encouraged to bring your partner to class! Required if planning waterbirth, no fee. Infant/Child CPR: Parents, grandparents, babysitters, and friends learn Cardio-Pulmonary Resuscitation skills for infants and children. You will also learn how to treat both conscious and unconscious choking in infants and children. This Family & Friends program does not offer certification. Register each participant individually to ensure that enough mannequins are available. (Call for fee) Grandparent Love: Expecting a grandbaby? This class is for you! Learn about the latest infant care and safety recommendations and ways to support your own child as he or she transitions into the parenting role. Taught by Registered Nurses who are childbirth instructors, but most importantly...they are grandmothers too! $10/person. Childbirth Class- Natural Childbirth: This series of 5 weekly classes is for expectant parents who want to learn and practice natural methods of coping with the process of labor and childbirth. Relaxation, breathing, massage, visualization, role of the partner, and helpful positioning are highlighted. Participants learn how to be confident in their body's ability to give birth. This class will empower and help parents make informed decisions about their own care. Includes discussion that will help new parents transition into the immediate postpartum period. Maternity Care Center Tour of [facility] is included. We suggest taking this class between 25-32 weeks, but it's only a recommendation. $75 per registrant and one support person or $30 Medicaid. Childbirth Class- 3 week Series: This option of 3 weekly classes helps you and your labor partner prepare for childbirth. Newborn  care, labor & birth, cesarean birth, pain management, and comfort techniques are discussed and a Maternity Care Center Tour of [facility] is included. The class meets at the same time, on the same day of the week for 3 consecutive weeks beginning with the starting date you choose. $60 for registrant and one support person.  Marvelous Multiples: Expecting twins, triplets, or more? This class covers the differences in labor, birth, parenting, and breastfeeding issues that face multiples' parents. NICU tour is included. Led by a Certified Childbirth Educator who is the mother of twins. No fee. Caring for Baby: This class is for expectant and adoptive parents who want to learn and practice the most up-to-date newborn care for their babies. Focus is on birth through the first six weeks of life. Topics include feeding, bathing, diapering, crying, umbilical cord care, circumcision care and safe sleep. Parents learn to recognize symptoms of illness and when to call the pediatrician. Register only the mom-to-be and your partner or support person can plan to come with you! $10 per registrant and support person Childbirth Class- online option: This online class offers you the freedom to complete a Birth and Baby series in the comfort of your own home. The flexibility of this option allows you to review sections at your own pace, at times convenient to you and your support people. It includes additional video information, animations, quizzes, and extended activities. Get organized with helpful eClass tools, checklists, and trackers. Once you register online for the class, you will receive an email within a few days to accept the invitation and begin the class when the time   is right for you. The content will be available to you for 60 days. $60 for 60 days of online access for you and your support people.         Healthy Weight Gain During Pregnancy, Adult A certain amount of weight gain during pregnancy is  normal and healthy. How much weight you should gain depends on your overall health and a measurement called BMI (body mass index). BMI is an estimate of your body fat based on your height and weight. You can use an Freight forwarder to figure out your BMI, or you can ask your health care provider to calculate it for you at your next visit. Your recommended pregnancy weight gain is based on your pre-pregnancy BMI. General guidelines for a healthy total weight gain during pregnancy are listed below. If your BMI at or before the start of your pregnancy is:  Less than 18.5 (underweight), you should gain 28-40 lb (13-18 kg).  18.5-24.9 (normal weight), you should gain 25-35 lb (11-16 kg).  25-29.9 (overweight), you should gain 15-25 lb (7-11 kg).  30 or higher (obese), you should gain 11-20 lb (5-9 kg). These ranges vary depending on your individual health. If you are carrying more than one baby (multiples), it may be safe to gain more weight than these recommendations. If you gain less weight than recommended, that may be safe as long as your baby is growing and developing normally. How can unhealthy weight gain affect me and my baby? Gaining too much weight during pregnancy can lead to pregnancy complications, such as:  A temporary form of diabetes that develops during pregnancy (gestational diabetes).  High blood pressure during pregnancy and protein in your urine (preeclampsia).  High blood pressure during pregnancy without protein in your urine (gestational hypertension).  Your baby having a high weight at birth, which may: ? Raise your risk of having a more difficult delivery or a surgical delivery (cesarean delivery, or C-section). ? Raise your child's risk of developing obesity during childhood. Not gaining enough weight can be life-threatening for your baby, and it may raise your baby's chances of:  Being born early (preterm).  Growing more slowly than normal during pregnancy (growth  restriction).  Having a low weight at birth. What actions can I take to gain a healthy amount of weight during pregnancy? General instructions  Keep track of your weight gain during pregnancy.  Take over-the-counter and prescription medicines only as told by your health care provider. Take all prenatal supplements as directed.  Keep all health care visits during pregnancy (prenatal visits). These visits are a good time to discuss your weight gain. Your health care provider will weigh you at each visit to make sure you are gaining a healthy amount of weight. Nutrition   Eat a balanced, nutrient-rich diet. Eat plenty of: ? Fruits and vegetables, such as berries and broccoli. ? Whole grains, such as millet, barley, whole-wheat breads and cereals, and oatmeal. ? Low-fat dairy products or non-dairy products such as almond milk or rice milk. ? Protein foods, such as lean meat, chicken, eggs, and legumes (such as peas, beans, soybeans, and lentils).  Avoid foods that are fried or have a lot of fat, salt (sodium), or sugar.  Drink enough fluid to keep your urine pale yellow.  Choose healthy snack and drink options when you are at work or on the go: ? Drink water. Avoid soda, sports drinks, and juices that have added sugar. ? Avoid drinks with caffeine, such as coffee  and energy drinks. ? Eat snacks that are high in protein, such as nuts, protein bars, and low-fat yogurt. ? Carry convenient snacks in your purse that do not need refrigeration, such as a pack of trail mix, an apple, or a granola bar.  If you need help improving your diet, work with a health care provider or a diet and nutrition specialist (dietitian). Activity   Exercise regularly, as told by your health care provider. ? If you were active before becoming pregnant, you may be able to continue your regular fitness activities. ? If you were not active before pregnancy, you may gradually build up to exercising for 30 or more  minutes on most days of the week. This may include walking, swimming, or yoga.  Ask your health care provider what activities are safe for you. Talk with your health care provider about whether you may need to be excused from certain school or work activities. Where to find more information Learn more about managing your weight gain during pregnancy from:  American Pregnancy Association: www.americanpregnancy.org  U.S. Department of Agriculture pregnancy weight gain calculator: https://ball-collins.biz/ Summary  Too much weight gain during pregnancy can lead to complications for you and your baby.  Find out your pre-pregnancy BMI to determine how much weight gain is healthy for you.  Eat nutritious foods and stay active.  Keep all of your prenatal visits as told by your health care provider. This information is not intended to replace advice given to you by your health care provider. Make sure you discuss any questions you have with your health care provider. Document Revised: 04/20/2019 Document Reviewed: 04/17/2017 Elsevier Patient Education  2020 [employer].

## 2020-01-06 NOTE — Progress Notes (Signed)
Subjective:   Abigail Perez is a 24 y.o. G1P0 at [redacted]w[redacted]d by early ultrasound being seen today for her first obstetrical visit.  Her obstetrical history is significant for obesity. Patient does intend to breast feed. Pregnancy history fully reviewed.  Patient reports intermittent vaginal pain & occasional dizziness. Reports sharp/cramping pains in her vagina that are intermittent and sporadic. Denies dysuria,vaginal bleeding, or vaginal discharge. Also reports occasional dizziness. Drinks 2 bottles of water per day and is trying to increase her intake. No syncopal episodes. Marland Kitchen  HISTORY: OB History  Gravida Para Term Preterm AB Living  1 0 0 0 0 0  SAB TAB Ectopic Multiple Live Births  0 0 0 0 0    # Outcome Date GA Lbr Len/2nd Weight Sex Delivery Anes PTL Lv  1 Current            Past Medical History:  Diagnosis Date  . GERD (gastroesophageal reflux disease)    Past Surgical History:  Procedure Laterality Date  . NO PAST SURGERIES     Family History  Problem Relation Age of Onset  . Diabetes Maternal Grandmother    Social History   Tobacco Use  . Smoking status: Never Smoker  . Smokeless tobacco: Never Used  Substance Use Topics  . Alcohol use: No  . Drug use: No   No Known Allergies Current Outpatient Medications on File Prior to Visit  Medication Sig Dispense Refill  . Blood Pressure Monitoring (BLOOD PRESSURE MONITOR AUTOMAT) DEVI 1 Device by Does not apply route daily. Automatic blood pressure cuff x-large size. To monitor blood pressure regularly at home. ICD-10 code:Z34.90 1 each 0  . Misc. Devices (GOJJI WEIGHT SCALE) MISC 1 Device by Does not apply route daily as needed. To weight self daily as needed at home. ICD-10 code: O09.90 1 each 0  . Prenatal Vit-Fe Fumarate-FA (PRENATAL MULTIVITAMIN) TABS tablet Take 1 tablet by mouth daily at 12 noon.    . [DISCONTINUED] Levonorgestrel-Ethinyl Estradiol (ASHLYNA) 0.15-0.03 &0.01 MG tablet Take 1 tablet by mouth daily.     . [DISCONTINUED] omeprazole (PRILOSEC) 20 MG capsule Take 1 capsule (20 mg total) by mouth daily. (Patient not taking: Reported on 11/06/2019) 30 capsule 0   No current facility-administered medications on file prior to visit.     Indications for ASA therapy (per uptodate) One of the following: Previous pregnancy with preeclampsia, especially early onset and with an adverse outcome No Multifetal gestation No Chronic hypertension No Type 1 or 2 diabetes mellitus No Chronic kidney disease No Autoimmune disease (antiphospholipid syndrome, systemic lupus erythematosus) No  Two or more of the following: Nulliparity Yes Obesity (body mass index >30 kg/m2) Yes Family history of preeclampsia in mother or sister No Age ?35 years No Sociodemographic characteristics (African American race, low socioeconomic level) Yes Personal risk factors (eg, previous pregnancy with low birth weight or small for gestational age infant, previous adverse pregnancy outcome [eg, stillbirth], interval >10 years between pregnancies) No  Indications for early GDM screening - BMI >24 (>22 in Asian Americans) AND one of the following:  First-degree relative with diabetes No BMI >30kg/m2 Yes Age > 25 No Previous birth of an infant weighing ?4000 g No Gestational diabetes mellitus in a previous pregnancy No Glycated hemoglobin ?5.7 percent (39 mmol/mol), impaired glucose tolerance, or impaired fasting glucose on previous testing No High-risk race/ethnicity (eg, African American, Latino, Native American, [place] American, Pacific Islander) Yes Previous stillbirth of unknown cause No Maternal birthweight > 9 lbs  No History of cardiovascular disease No Hypertension or on therapy for hypertension No High-density lipoprotein cholesterol level <35 mg/dL (0.90 mmol/L) and/or a triglyceride level >250 mg/dL (2.82 mmol/L) No Polycystic ovary syndrome No Physical inactivity Yes Other clinical condition associated with  insulin resistance (eg, severe obesity, acanthosis nigricans) Yes Current use of glucocorticoids No   Early screening tests: FBS, A1C, Random CBG, glucose challenge     Exam   Vitals:   01/06/20 0926  BP: 118/78  Pulse: 88  Temp: 98.3 F (36.8 C)  Weight: 273 lb 12.8 oz (124.2 kg)   Fetal Heart Rate (bpm): 155  Uterus:     Pelvic Exam: Perineum: no hemorrhoids, normal perineum   Vulva: normal external genitalia, no lesions   Vagina:  normal mucosa, normal discharge   Cervix: no lesions and normal, pap smear done.    Adnexa: normal adnexa and no mass, fullness, tenderness   Bony Pelvis: average  System: General: well-developed, well-nourished female in no acute distress   Breast:  normal appearance, no masses or tenderness   Skin: normal coloration and turgor, no rashes   Neurologic: oriented, normal, negative, normal mood   Extremities: normal strength, tone, and muscle mass, ROM of all joints is normal   HEENT PERRLA, extraocular movement intact and sclera clear, anicteric   Mouth/Teeth mucous membranes moist, pharynx normal without lesions and dental hygiene good   Neck supple and no masses   Cardiovascular: regular rate and rhythm   Respiratory:  no respiratory distress, normal breath sounds   Abdomen: soft, non-tender; bowel sounds normal; no masses,  no organomegaly     Assessment:   Pregnancy: G1P0 Patient Active Problem List   Diagnosis Date Noted  . Obesity affecting pregnancy in second trimester 01/06/2020  . Supervision of normal first pregnancy, antepartum 12/20/2019     Plan:  1. Supervision of normal first pregnancy, antepartum -reviewed Ob lab results. Discussed low risk NIPs - did not disclose gender as she will be having a gender reveal party -she thinks she had an abnormal pap smear in September at [facility] ob[facility] -- results not in her app. Will collect ROI today for records from them but go ahead and collect pap smear since status unknown and  possibly abnormal.  - Cytology - PAP( [place]) - Cervicovaginal ancillary only( [place]) - [place] MFM OB COMP + 14 WK; Future  2. Obesity affecting pregnancy in second trimester -discussed implications of obesity in pregnancy & recommended weight gain. Will start daily baby ASA and refer to nutrition - aspirin EC 81 MG tablet; Take 1 tablet (81 mg total) by mouth daily. Take after 12 weeks for prevention of preeclampsia later in pregnancy  Dispense: 30 tablet; Refill: 5 - Referral to Nutrition and Diabetes Services   Initial labs drawn. Started on ASA Continue prenatal vitamins. Genetic Screening discussed, NIPS: results reviewed. Ultrasound discussed; fetal anatomic survey: ordered. Problem list reviewed and updated. The nature of [place] with multiple MDs and other Advanced Practice Providers was explained to patient; also emphasized that residents, students are part of our team. Routine obstetric precautions reviewed. Return in about 4 weeks (around 02/03/2020) for Routine OB.   Jorje Guild 10:58 AM 01/06/20

## 2020-01-10 LAB — CYTOLOGY - PAP: Diagnosis: NEGATIVE

## 2020-01-11 LAB — CERVICOVAGINAL ANCILLARY ONLY
Bacterial Vaginitis (gardnerella): NEGATIVE
Candida Glabrata: NEGATIVE
Candida Vaginitis: NEGATIVE
Chlamydia: NEGATIVE
Comment: NEGATIVE
Comment: NEGATIVE
Comment: NEGATIVE
Comment: NEGATIVE
Comment: NEGATIVE
Comment: NORMAL
Neisseria Gonorrhea: NEGATIVE
Trichomonas: NEGATIVE

## 2020-01-24 ENCOUNTER — Other Ambulatory Visit: Payer: Self-pay | Admitting: *Deleted

## 2020-01-24 DIAGNOSIS — Z3400 Encounter for supervision of normal first pregnancy, unspecified trimester: Secondary | ICD-10-CM

## 2020-01-24 DIAGNOSIS — O99212 Obesity complicating pregnancy, second trimester: Secondary | ICD-10-CM

## 2020-01-24 NOTE — Progress Notes (Signed)
Order for ultrasound needed to be change to MFM detailed +14 wk due to obesity in mother.  Clovis Pu, RN

## 2020-01-25 ENCOUNTER — Encounter: Payer: Self-pay | Admitting: Student

## 2020-01-25 DIAGNOSIS — R12 Heartburn: Secondary | ICD-10-CM

## 2020-01-26 ENCOUNTER — Encounter: Payer: Self-pay | Admitting: General Practice

## 2020-01-26 MED ORDER — PANTOPRAZOLE SODIUM 40 MG PO TBEC: 40.0000 mg | DELAYED_RELEASE_TABLET | Freq: Every day | ORAL | 2 refills | Status: DC

## 2020-01-27 ENCOUNTER — Other Ambulatory Visit: Payer: Self-pay | Admitting: *Deleted

## 2020-01-27 ENCOUNTER — Encounter: Payer: Self-pay | Admitting: Obstetrics and Gynecology

## 2020-02-02 ENCOUNTER — Telehealth (INDEPENDENT_AMBULATORY_CARE_PROVIDER_SITE_OTHER): Payer: Medicaid Other | Admitting: Obstetrics and Gynecology

## 2020-02-02 ENCOUNTER — Encounter: Payer: Self-pay | Admitting: Obstetrics and Gynecology

## 2020-02-02 ENCOUNTER — Other Ambulatory Visit: Payer: Self-pay

## 2020-02-02 DIAGNOSIS — E669 Obesity, unspecified: Secondary | ICD-10-CM

## 2020-02-02 DIAGNOSIS — O99212 Obesity complicating pregnancy, second trimester: Secondary | ICD-10-CM

## 2020-02-02 DIAGNOSIS — Z3A18 18 weeks gestation of pregnancy: Secondary | ICD-10-CM

## 2020-02-02 DIAGNOSIS — Z3400 Encounter for supervision of normal first pregnancy, unspecified trimester: Secondary | ICD-10-CM

## 2020-02-02 NOTE — Progress Notes (Signed)
   MY CHART VIDEO VIRTUAL OBSTETRICS VISIT ENCOUNTER NOTE  I connected with Abigail Perez on 02/02/20 at  2:30 PM EDT by My Chart video at home and verified that I am speaking with the correct person using two identifiers. Provider located at [facility] for [employer] at [place].   I discussed the limitations, risks, security and privacy concerns of performing an evaluation and management service by My Chart video and the availability of in person appointments. I also discussed with the patient that there may be a patient responsible charge related to this service. The patient expressed understanding and agreed to proceed.  Subjective:  Abigail Perez is a 24 y.o. G1P0 at [redacted]w[redacted]d being followed for ongoing prenatal care.  She is currently monitored for the following issues for this low-risk pregnancy and has Supervision of normal first pregnancy, antepartum and Obesity affecting pregnancy in second trimester on their problem list.  Patient reports Have not felt FM yet. She reports that she had a migraine Tuesday 01/31/20. She reports sx's of eye pressure, pain at back of neck, and photophobia. She states the migraine has gone, but now she has a "whooshing" sound in her RT ear. Reports no fetal movement. Denies any contractions, bleeding or leaking of fluid.   The following portions of the patient's history were reviewed and updated as appropriate: allergies, current medications, past family history, past medical history, past social history, past surgical history and problem list.   Objective:   General:  Alert, oriented and cooperative.   Mental Status: Normal mood and affect perceived. Normal judgment and thought content.  Rest of physical exam deferred due to type of encounter  LMP 09/09/2019  **Not done by patient's own at home BP cuff and scale -- patient not at home. Will message vital signs later.  Assessment and Plan:  Pregnancy: G1P0 at [redacted]w[redacted]d  1. Supervision of normal first  pregnancy, antepartum - Discussed it is normal to feel FM in first pregnancy between 18-20 wks - Anticipatory guidance for next visit being My Chart in 4 wks  2. Obesity affecting pregnancy in second trimester - Continue bASA daily  Preterm labor symptoms and general obstetric precautions including but not limited to vaginal bleeding, contractions, leaking of fluid and fetal movement were reviewed in detail with the patient.  I discussed the assessment and treatment plan with the patient. The patient was provided an opportunity to ask questions and all were answered. The patient agreed with the plan and demonstrated an understanding of the instructions. The patient was advised to call back or seek an in-person office evaluation/go to [facility] at [facility] for any urgent or concerning symptoms. Please refer to After Visit Summary for other counseling recommendations.   I provided 5 minutes of non-face-to-face time during this encounter. There was 5 minutes of chart review time spent prior to this encounter. Total time spent = 10 minutes.  Return in about 4 weeks (around 03/01/2020) for Return OB - My Chart video.  Future Appointments  Date Time Provider Department Center  02/02/2020  2:30 PM Raelyn Mora, CNM [facility] None  02/07/2020  1:30 PM [facility] NURSE [facility]  02/07/2020  1:30 PM [facility] US3 [facility]    Raelyn Mora, CNM Center for [facility], [facility]

## 2020-02-02 NOTE — Patient Instructions (Signed)

## 2020-02-05 ENCOUNTER — Other Ambulatory Visit: Payer: Self-pay

## 2020-02-05 ENCOUNTER — Ambulatory Visit
Admission: EM | Admit: 2020-02-05 | Discharge: 2020-02-05 | Disposition: A | Discharge status: Home / Self Care | Payer: Medicaid Other | Attending: Emergency Medicine | Admitting: Emergency Medicine

## 2020-02-05 DIAGNOSIS — H9201 Otalgia, right ear: Secondary | ICD-10-CM

## 2020-02-05 DIAGNOSIS — R42 Dizziness and giddiness: Secondary | ICD-10-CM

## 2020-02-05 DIAGNOSIS — Z3A18 18 weeks gestation of pregnancy: Secondary | ICD-10-CM

## 2020-02-05 MED ORDER — AMOXICILLIN 500 MG PO CAPS
500.0000 mg | ORAL_CAPSULE | Freq: Two times a day (BID) | ORAL | 0 refills | Status: AC
Start: 2020-02-05 — End: 2020-02-15

## 2020-02-05 NOTE — ED Triage Notes (Signed)
Pt presents with right side ear fullness and dizziness after headache last week

## 2020-02-05 NOTE — Discharge Instructions (Addendum)
Unable to rule out neurological disease in urgent care setting.  Offered patient further evaluation and management in the ED.  Patient declines at this time and would like to try outpatient therapy first.  Aware of the risk associated with this decision including missed diagnosis, organ damage, organ failure, and/or death.  Patient aware and in agreement.     Rest and drink plenty of fluids Will treat for possible ear infection Continue to use OTC tylenol as needed for pain control Follow up with PCP or OB for recheck and to ensure symptoms are improving Return here or go to the ER if you have any new or worsening symptoms fever, chills, nausea, vomiting, aura, rhinorrhea, watery eyes, balance problems, light or noise sensitivity, dizziness, visual problems, chest pain, shortness of breath, abdominal pain, weakness, numbness or tingling, feelings of fogginess, feeling slowed down, difficulty concentrating, difficulty remembering, etc..Marland Kitchen

## 2020-02-05 NOTE — ED Provider Notes (Signed)
[facility]   [id number] 02/05/20 Arrival Time: 1435  CC: EAR PAIN and episode of dizziness  SUBJECTIVE: History from: patient.  Abigail Perez is a 24 y.o. female [redacted] weeks pregnant, who presents with of right ear pressure/ throbbing sensation x 10 days.  Denies a precipitating event, such as swimming or wearing ear plugs.  Patient states the pain is constant and throbbing sensation in ear, but not painful.  Denies alleviating or aggravating factor.  Denies similar symptoms in the past.    Denies fever, chills, fatigue, sinus pain, rhinorrhea, ear discharge, sore throat, SOB, wheezing, chest pain, nausea, changes in bowel or bladder habits.    Mentions an episode of "dizziness" yesterday while in the shower.  Describes it as feeling like she was going to faint/ pass out.  Got out of the shower and rest with relief.  Episodes lasting 5 minutes.  Then had another episodes shortly after while making a meal.  Rested and had water with relief.  Denies previous symptoms in the past.  OB advised her to come to Urgent care.  Report migraine earlier in the week.  Currently asymptomatic.  Otherwise denies headache, vision changes, facial droop, slurred speech, weakness.    ROS: As per HPI.  All other pertinent ROS negative.     Past Medical History:  Diagnosis Date   GERD (gastroesophageal reflux disease)    Past Surgical History:  Procedure Laterality Date   NO PAST SURGERIES     No Known Allergies No current facility-administered medications on file prior to encounter.   Current Outpatient Medications on File Prior to Encounter  Medication Sig Dispense Refill   aspirin EC 81 MG tablet Take 1 tablet (81 mg total) by mouth daily. Take after 12 weeks for prevention of preeclampsia later in pregnancy 30 tablet 5   pantoprazole (PROTONIX) 40 MG tablet Take 1 tablet (40 mg total) by mouth daily. 30 tablet 2   Prenatal Vit-Fe Fumarate-FA (PRENATAL MULTIVITAMIN) TABS tablet Take 1  tablet by mouth daily at 12 noon.     [DISCONTINUED] Levonorgestrel-Ethinyl Estradiol (ASHLYNA) 0.15-0.03 &0.01 MG tablet Take 1 tablet by mouth daily.     [DISCONTINUED] omeprazole (PRILOSEC) 20 MG capsule Take 1 capsule (20 mg total) by mouth daily. (Patient not taking: Reported on 11/06/2019) 30 capsule 0   Social History   Socioeconomic History   Marital status: Single    Spouse name: Not on file   Number of children: Not on file   Years of education: Not on file   Highest education level: Bachelor's degree (e.g., BA, AB, BS)  Occupational History   Not on file  Tobacco Use   Smoking status: Never Smoker   Smokeless tobacco: Never Used  Vaping Use   Vaping Use: Never used  Substance and Sexual Activity   Alcohol use: No   Drug use: No   Sexual activity: Yes    Birth control/protection: Pill    Comment: Junel-last took 11/05/19  Other Topics Concern   Not on file  Social History Narrative   Not on file   Social Determinants of Health   Financial Resource Strain:    Difficulty of Paying Living Expenses:   Food Insecurity:    Worried About Programme researcher, broadcasting/film/video in the Last Year:    Barista in the Last Year:   Transportation Needs:    Lack of Transportation (Medical):    Lack of Transportation (Non-Medical):   Physical Activity:    Days  of Exercise per Week:    Minutes of Exercise per Session:   Stress:    Feeling of Stress :   Social Connections:    Frequency of Communication with Friends and Family:    Frequency of Social Gatherings with Friends and Family:    Attends Religious Services:    Active Member of Clubs or Organizations:    Attends Music therapist:    Marital Status:   Intimate Partner Violence:    Fear of Current or Ex-Partner:    Emotionally Abused:    Physically Abused:    Sexually Abused:    Family History  Problem Relation Age of Onset   Diabetes Maternal Grandmother    Hypertension  Maternal Grandmother    Hypertension Mother     OBJECTIVE:  Vitals:   02/05/20 1451  BP: 111/75  Pulse: (!) 101  Resp: 18  Temp: 98.9 F (37.2 C)  SpO2: 98%     General appearance: alert; well-appearing, nontoxic; speaking in full sentences and tolerating own secretions HEENT: NCAT; Ears: EACs clear, LT TM pearly gray, RT TM mildly erythematous in 4 o'clock position; Eyes: PERRL.  EOM grossly intact.Nose: nares patent without rhinorrhea, Throat: oropharynx clear, tonsils non erythematous or enlarged, uvula midline  Neck: supple without LAD Lungs: unlabored respirations, symmetrical air entry; cough: absent; no respiratory distress; CTAB Heart: regular rate and rhythm.  Abdomen: pregnant Neuro: CN 2-12 grossly intact; strength and sensation intact about the UEs and LEs; finger to nose without difficulty; negative pronator drift Skin: warm and dry Psychological: alert and cooperative; normal mood and affect    ASSESSMENT & PLAN:  1. Discomfort of right ear   2. Dizziness and giddiness   3. [redacted] weeks gestation of pregnancy     Meds ordered this encounter  Medications   amoxicillin (AMOXIL) 500 MG capsule    Sig: Take 1 capsule (500 mg total) by mouth 2 (two) times daily for 10 days.    Dispense:  20 capsule    Refill:  0    Order Specific Question:   Supervising Provider    Answer:   Raylene Everts [[id number]]   Unable to rule out neurological disease in urgent care setting.  Offered patient further evaluation and management in the ED.  Patient declines at this time and would like to try outpatient therapy first.  Aware of the risk associated with this decision including missed diagnosis, organ damage, organ failure, and/or death.  Patient aware and in agreement.     Rest and drink plenty of fluids Will treat for possible ear infection Continue to use OTC tylenol as needed for pain control Follow up with PCP or OB for recheck and to ensure symptoms are  improving Return here or go to the ER if you have any new or worsening symptoms fever, chills, nausea, vomiting, aura, rhinorrhea, watery eyes, balance problems, light or noise sensitivity, dizziness, visual problems, chest pain, shortness of breath, abdominal pain, weakness, numbness or tingling, feelings of fogginess, feeling slowed down, difficulty concentrating, difficulty remembering, etc...  Reviewed expectations re: course of current medical issues. Questions answered. Outlined signs and symptoms indicating need for more acute intervention. Patient verbalized understanding. After Visit Summary given.         Lestine Box, PA-C 02/05/20 1534

## 2020-02-07 ENCOUNTER — Ambulatory Visit: Payer: Medicaid Other

## 2020-02-07 ENCOUNTER — Other Ambulatory Visit: Payer: Self-pay

## 2020-02-07 ENCOUNTER — Ambulatory Visit: Payer: Medicaid Other | Admitting: *Deleted

## 2020-02-07 ENCOUNTER — Encounter: Payer: Self-pay | Admitting: *Deleted

## 2020-02-07 ENCOUNTER — Ambulatory Visit: Payer: Medicaid Other | Attending: Obstetrics and Gynecology

## 2020-02-07 ENCOUNTER — Other Ambulatory Visit: Payer: Self-pay | Admitting: *Deleted

## 2020-02-07 DIAGNOSIS — O99212 Obesity complicating pregnancy, second trimester: Secondary | ICD-10-CM

## 2020-02-07 DIAGNOSIS — Z3A19 19 weeks gestation of pregnancy: Secondary | ICD-10-CM | POA: Diagnosis not present

## 2020-02-07 DIAGNOSIS — Z3400 Encounter for supervision of normal first pregnancy, unspecified trimester: Secondary | ICD-10-CM | POA: Diagnosis not present

## 2020-02-07 DIAGNOSIS — E669 Obesity, unspecified: Secondary | ICD-10-CM | POA: Diagnosis not present

## 2020-02-07 DIAGNOSIS — Z363 Encounter for antenatal screening for malformations: Secondary | ICD-10-CM | POA: Diagnosis not present

## 2020-02-07 DIAGNOSIS — Z3689 Encounter for other specified antenatal screening: Secondary | ICD-10-CM

## 2020-02-16 ENCOUNTER — Other Ambulatory Visit: Payer: Self-pay | Admitting: Obstetrics and Gynecology

## 2020-02-16 DIAGNOSIS — H9319 Tinnitus, unspecified ear: Secondary | ICD-10-CM

## 2020-02-18 ENCOUNTER — Other Ambulatory Visit: Payer: Self-pay | Admitting: Obstetrics and Gynecology

## 2020-02-18 DIAGNOSIS — R519 Headache, unspecified: Secondary | ICD-10-CM

## 2020-02-18 MED ORDER — CYCLOBENZAPRINE HCL 10 MG PO TABS: 10.0000 mg | ORAL_TABLET | Freq: Three times a day (TID) | ORAL | 1 refills | Status: DC | PRN

## 2020-02-27 ENCOUNTER — Other Ambulatory Visit: Payer: Self-pay

## 2020-02-27 ENCOUNTER — Telehealth: Payer: Medicaid Other | Admitting: Family

## 2020-02-27 ENCOUNTER — Inpatient Hospital Stay (HOSPITAL_COMMUNITY)
Admission: AD | Admit: 2020-02-27 | Discharge: 2020-02-27 | Disposition: A | Discharge status: Home / Self Care | Payer: Medicaid Other | Attending: Obstetrics & Gynecology | Admitting: Obstetrics & Gynecology

## 2020-02-27 DIAGNOSIS — O26892 Other specified pregnancy related conditions, second trimester: Secondary | ICD-10-CM | POA: Insufficient documentation

## 2020-02-27 DIAGNOSIS — Z3A21 21 weeks gestation of pregnancy: Secondary | ICD-10-CM | POA: Diagnosis not present

## 2020-02-27 DIAGNOSIS — O99891 Other specified diseases and conditions complicating pregnancy: Secondary | ICD-10-CM

## 2020-02-27 DIAGNOSIS — R05 Cough: Secondary | ICD-10-CM | POA: Insufficient documentation

## 2020-02-27 DIAGNOSIS — H9209 Otalgia, unspecified ear: Secondary | ICD-10-CM | POA: Insufficient documentation

## 2020-02-27 DIAGNOSIS — R519 Headache, unspecified: Secondary | ICD-10-CM | POA: Diagnosis not present

## 2020-02-27 DIAGNOSIS — J069 Acute upper respiratory infection, unspecified: Secondary | ICD-10-CM

## 2020-02-27 DIAGNOSIS — R0989 Other specified symptoms and signs involving the circulatory and respiratory systems: Secondary | ICD-10-CM

## 2020-02-27 MED ORDER — BENZONATATE 100 MG PO CAPS: 100.0000 mg | ORAL_CAPSULE | Freq: Three times a day (TID) | ORAL | 0 refills | Status: DC | PRN

## 2020-02-27 MED ORDER — FLUTICASONE PROPIONATE 50 MCG/ACT NA SUSP: 2.0000 | Freq: Every day | NASAL | 6 refills | Status: DC

## 2020-02-27 NOTE — MAU Provider Note (Signed)
S Ms. Abigail Perez is a 24 y.o. G1P0 female @21 .6 wks who presents to MAU today with complaint of ear pain, cough, and HA. Denies pregnancy complaints.   O BP 129/76   Pulse 88   Temp 98.8 F (37.1 C)   Resp 19   Wt 125.2 kg   LMP 09/09/2019   BMI 43.25 kg/m  Physical Exam Vitals and nursing note reviewed. Exam conducted with a chaperone present.  Constitutional:      General: She is not in acute distress. HENT:     Head: Normocephalic and atraumatic.  Cardiovascular:     Rate and Rhythm: Normal rate.  Pulmonary:     Effort: Pulmonary effort is normal. No respiratory distress.  Musculoskeletal:     Cervical back: Normal range of motion.  Neurological:     General: No focal deficit present.     Mental Status: She is alert and oriented to person, place, and time.  Psychiatric:        Mood and Affect: Mood normal.   FHT 150  MDM: No pregnancy complaints, will transfer to ED for resp eval, Dr. 09/11/2019 accepting.   A [redacted] weeks gestation Upper respiratory symptoms Medical screening exam complete  P Discharge from MAU in stable condition Patient given the option of transfer to [facility] for further evaluation or seek care in outpatient facility of choice- pt prefers outpt evaluation  List of options for follow-up given  Warning signs for worsening condition that would warrant emergency follow-up discussed Patient may return to MAU as needed for pregnancy related complaints  [facility], CNM 02/27/2020 8:30 PM

## 2020-02-27 NOTE — Progress Notes (Signed)
We are sorry you are not feeling well.  Here is how we plan to help!  Based on what you have shared with me, it looks like you may have a viral upper respiratory infection.  Upper respiratory infections are caused by a large number of viruses; however, rhinovirus is the most common cause.   You also need to be tested for COVID to rule out.  Symptoms vary from person to person, with common symptoms including sore throat, cough, fatigue or lack of energy and feeling of general discomfort.  A low-grade fever of up to 100.4 may present, but is often uncommon.  Symptoms vary however, and are closely related to a person's age or underlying illnesses.  The most common symptoms associated with an upper respiratory infection are nasal discharge or congestion, cough, sneezing, headache and pressure in the ears and face.  These symptoms usually persist for about 3 to 10 days, but can last up to 2 weeks.  It is important to know that upper respiratory infections do not cause serious illness or complications in most cases.    Upper respiratory infections can be transmitted from person to person, with the most common method of transmission being a person's hands.  The virus is able to live on the skin and can infect other persons for up to 2 hours after direct contact.  Also, these can be transmitted when someone coughs or sneezes; thus, it is important to cover the mouth to reduce this risk.  To keep the spread of the illness at bay, good hand hygiene is very important.  This is an infection that is most likely caused by a virus. There are no specific treatments other than to help you with the symptoms until the infection runs its course.  We are sorry you are not feeling well.  Here is how we plan to help!   For nasal congestion, you may use an oral decongestants such as Mucinex D or if you have glaucoma or high blood pressure use plain Mucinex.  Saline nasal spray or nasal drops can help and can safely be used as  often as needed for congestion.  For your congestion, I have prescribed Fluticasone nasal spray one spray in each nostril twice a day  If you do not have a history of heart disease, hypertension, diabetes or thyroid disease, prostate/bladder issues or glaucoma, you may also use Sudafed to treat nasal congestion.  It is highly recommended that you consult with a pharmacist or your primary care physician to ensure this medication is safe for you to take.     If you have a cough, you may use cough suppressants such as Delsym and Robitussin.  If you have glaucoma or high blood pressure, you can also use Coricidin HBP.   For cough I have prescribed for you A prescription cough medication called Tessalon Perles 100 mg. You may take 1-2 capsules every 8 hours as needed for cough  If you have a sore or scratchy throat, use a saltwater gargle-  to  teaspoon of salt dissolved in a 4-ounce to 8-ounce glass of warm water.  Gargle the solution for approximately 15-30 seconds and then spit.  It is important not to swallow the solution.  You can also use throat lozenges/cough drops and Chloraseptic spray to help with throat pain or discomfort.  Warm or cold liquids can also be helpful in relieving throat pain.  For headache, pain or general discomfort, you can use Ibuprofen or Tylenol as directed.     Some authorities believe that zinc sprays or the use of Echinacea may shorten the course of your symptoms.   HOME CARE . Only take medications as instructed by your medical team. . Be sure to drink plenty of fluids. Water is fine as well as fruit juices, sodas and electrolyte beverages. You may want to stay away from caffeine or alcohol. If you are nauseated, try taking small sips of liquids. How do you know if you are getting enough fluid? Your urine should be a pale yellow or almost colorless. . Get rest. . Taking a steamy shower or using a humidifier may help nasal congestion and ease sore throat pain. You can  place a towel over your head and breathe in the steam from hot water coming from a faucet. . Using a saline nasal spray works much the same way. . Cough drops, hard candies and sore throat lozenges may ease your cough. . Avoid close contacts especially the very young and the elderly . Cover your mouth if you cough or sneeze . Always remember to wash your hands.   GET HELP RIGHT AWAY IF: . You develop worsening fever. . If your symptoms do not improve within 10 days . You develop yellow or green discharge from your nose over 3 days. . You have coughing fits . You develop a severe head ache or visual changes. . You develop shortness of breath, difficulty breathing or start having chest pain . Your symptoms persist after you have completed your treatment plan  MAKE SURE YOU   Understand these instructions.  Will watch your condition.  Will get help right away if you are not doing well or get worse.  Your e-visit answers were reviewed by a board certified advanced clinical practitioner to complete your personal care plan. Depending upon the condition, your plan could have included both over the counter or prescription medications. Please review your pharmacy choice. If there is a problem, you may call our nursing hot line at and have the prescription routed to another pharmacy. Your safety is important to us. If you have drug allergies check your prescription carefully.   You can use MyChart to ask questions about today's visit, request a non-urgent call back, or ask for a work or school excuse for 24 hours related to this e-Visit. If it has been greater than 24 hours you will need to follow up with your provider, or enter a new e-Visit to address those concerns. You will get an e-mail in the next two days asking about your experience.  I hope that your e-visit has been valuable and will speed your recovery. Thank you for using e-visits.  Approximately 5 minutes was spent documenting and  reviewing patient's chart.      

## 2020-02-27 NOTE — MAU Note (Signed)
Patient reports a bad cough, upper respiratory symptoms, headaches, and right sided ear pain that started after a migraine a few weeks ago.  Denies VB/LOF/abdominal pain.

## 2020-03-01 ENCOUNTER — Telehealth (INDEPENDENT_AMBULATORY_CARE_PROVIDER_SITE_OTHER): Payer: Medicaid Other | Admitting: Obstetrics and Gynecology

## 2020-03-01 ENCOUNTER — Encounter: Payer: Self-pay | Admitting: Obstetrics and Gynecology

## 2020-03-01 VITALS — BP 133/83 | HR 83 | Wt 276.2 lb

## 2020-03-01 DIAGNOSIS — Z3400 Encounter for supervision of normal first pregnancy, unspecified trimester: Secondary | ICD-10-CM

## 2020-03-01 DIAGNOSIS — E669 Obesity, unspecified: Secondary | ICD-10-CM

## 2020-03-01 DIAGNOSIS — Z3A22 22 weeks gestation of pregnancy: Secondary | ICD-10-CM

## 2020-03-01 DIAGNOSIS — O162 Unspecified maternal hypertension, second trimester: Secondary | ICD-10-CM

## 2020-03-01 DIAGNOSIS — R519 Headache, unspecified: Secondary | ICD-10-CM

## 2020-03-01 DIAGNOSIS — O99212 Obesity complicating pregnancy, second trimester: Secondary | ICD-10-CM

## 2020-03-01 DIAGNOSIS — O169 Unspecified maternal hypertension, unspecified trimester: Secondary | ICD-10-CM

## 2020-03-01 DIAGNOSIS — O26892 Other specified pregnancy related conditions, second trimester: Secondary | ICD-10-CM

## 2020-03-01 NOTE — Patient Instructions (Addendum)
Please call [facility] at [phone] to get scheduled to see a provider there for the buzzing/thumping in your ear.  Someone from the [facility] will call you to schedule an appointment with one of the neurologist. Please make sure you answer your phone or update your phone number in My Chart so they can contact you in a timely manner.

## 2020-03-01 NOTE — Progress Notes (Signed)
° °  MY CHART VIDEO VIRTUAL OBSTETRICS VISIT ENCOUNTER NOTE  I connected with Abigail Perez on 03/01/20 at  2:30 PM EDT by My Chart video at home and verified that I am speaking with the correct person using two identifiers. Provider located at [employer] for [employer] at [place].   I discussed the limitations, risks, security and privacy concerns of performing an evaluation and management service by My Chart video and the availability of in person appointments. I also discussed with the patient that there may be a patient responsible charge related to this service. The patient expressed understanding and agreed to proceed.  Subjective:  Abigail Perez is a 24 y.o. G1P0 at 28w2dbeing followed for ongoing prenatal care.  She is currently monitored for the following issues for this low-risk pregnancy and has Supervision of normal first pregnancy, antepartum and Obesity affecting pregnancy in second trimester on their problem list.  Patient reports H/A that is not relieved with Tylenol or Flexeril and the "buzzing/thumping" in ear. Reports fetal movement. Denies any contractions, bleeding or leaking of fluid.   The following portions of the patient's history were reviewed and updated as appropriate: allergies, current medications, past family history, past medical history, past social history, past surgical history and problem list.   Objective:   General:  Alert, oriented and cooperative.   Mental Status: Normal mood and affect perceived. Normal judgment and thought content.  Rest of physical exam deferred due to type of encounter   **Done by patient's own at home BP cuff and scale Vitals:   03/01/20 1436 03/01/20 1521  BP: (!) 148/86 (!) 133/83  Pulse: 76 83    Assessment and Plan:  Pregnancy: G1P0 at 285w2d1. Obesity affecting pregnancy in second trimester - Continue daily bASA  - Re-explained the importance for taking bASA daily to prevent PEC  2. Supervision of normal  first pregnancy, antepartum - Anticipatory guidance for video visit in 4 wks and 2 hr GTT @ 28 wks  3. Nonintractable headache, unspecified chronicity pattern, unspecified headache type - Ambulatory referral to Neurology - Advised to continue Flexeril and Tylenol until changed by neurology  4. Elevated blood pressure affecting pregnancy, antepartum - CBC - Comp Met (CMET) - Protein / creatinine ratio, urine  Preterm labor symptoms and general obstetric precautions including but not limited to vaginal bleeding, contractions, leaking of fluid and fetal movement were reviewed in detail with the patient.  I discussed the assessment and treatment plan with the patient. The patient was provided an opportunity to ask questions and all were answered. The patient agreed with the plan and demonstrated an understanding of the instructions. The patient was advised to call back or seek an in-person office evaluation/[facility] any [facility] or concerning symptoms. Please refer to After Visit Summary for other counseling recommendations.   I provided 10 minutes of non-face-to-face time during this encounter. There was 5 minutes of chart review time spent prior to this encounter. Total time spent = 15 minutes.  Return in about 4 weeks (around 03/29/2020) for Return OB - My Chart video.  Future Appointments  Date Time Provider [facility] 10[facility]00 [facility] None  03/09/2020  9:30 AM [facility]URSE [facility]  9:45 AM [facility] US5 [facility]8/19/2021 10:30 AM DaLaury DeepCNM [facility] None    RoLaury DeepCNHughesor Wo[facility] CompanyCoMiffl[facility]

## 2020-03-02 ENCOUNTER — Other Ambulatory Visit: Payer: Self-pay

## 2020-03-02 ENCOUNTER — Other Ambulatory Visit: Payer: Self-pay | Admitting: Obstetrics and Gynecology

## 2020-03-02 ENCOUNTER — Other Ambulatory Visit: Payer: Medicaid Other

## 2020-03-02 DIAGNOSIS — Z3402 Encounter for supervision of normal first pregnancy, second trimester: Secondary | ICD-10-CM

## 2020-03-03 LAB — COMPREHENSIVE METABOLIC PANEL
ALT: 8 IU/L (ref 0–32)
AST: 14 IU/L (ref 0–40)
Albumin/Globulin Ratio: 1.5 (ref 1.2–2.2)
Albumin: 3.8 g/dL — ABNORMAL LOW (ref 3.9–5.0)
Alkaline Phosphatase: 82 IU/L (ref 48–121)
BUN/Creatinine Ratio: 12 (ref 9–23)
BUN: 6 mg/dL (ref 6–20)
Bilirubin Total: <0.2 mg/dL (ref 0.0–1.2)
CO2: 19 mmol/L — ABNORMAL LOW (ref 20–29)
Calcium: 9.2 mg/dL (ref 8.7–10.2)
Chloride: 102 mmol/L (ref 96–106)
Creatinine, Ser: 0.49 mg/dL — ABNORMAL LOW (ref 0.57–1.00)
GFR calc Af Amer: 158 mL/min/{1.73_m2} (ref >59)
GFR calc non Af Amer: 137 mL/min/{1.73_m2} (ref >59)
Globulin, Total: 2.6 g/dL (ref 1.5–4.5)
Glucose: 89 mg/dL (ref 65–99)
Potassium: 3.6 mmol/L (ref 3.5–5.2)
Sodium: 137 mmol/L (ref 134–144)
Total Protein: 6.4 g/dL (ref 6.0–8.5)

## 2020-03-03 LAB — CBC
Hematocrit: 32.2 % — ABNORMAL LOW (ref 34.0–46.6)
Hemoglobin: 10.7 g/dL — ABNORMAL LOW (ref 11.1–15.9)
MCH: 28.3 pg (ref 26.6–33.0)
MCHC: 33.2 g/dL (ref 31.5–35.7)
MCV: 85 fL (ref 79–97)
Platelets: 352 10*3/uL (ref 150–450)
RBC: 3.78 x10E6/uL (ref 3.77–5.28)
RDW: 13.9 % (ref 11.7–15.4)
WBC: 11.4 10*3/uL — ABNORMAL HIGH (ref 3.4–10.8)

## 2020-03-03 LAB — PROTEIN / CREATININE RATIO, URINE
Creatinine, Urine: 151.4 mg/dL
Protein, Ur: 15.5 mg/dL
Protein/Creat Ratio: 102 mg/g creat (ref 0–200)

## 2020-03-09 ENCOUNTER — Other Ambulatory Visit: Payer: Self-pay | Admitting: *Deleted

## 2020-03-09 ENCOUNTER — Other Ambulatory Visit: Payer: Self-pay

## 2020-03-09 ENCOUNTER — Ambulatory Visit: Payer: Medicaid Other | Admitting: *Deleted

## 2020-03-09 ENCOUNTER — Ambulatory Visit: Payer: Medicaid Other | Attending: Obstetrics and Gynecology

## 2020-03-09 DIAGNOSIS — Z3689 Encounter for other specified antenatal screening: Secondary | ICD-10-CM | POA: Insufficient documentation

## 2020-03-09 DIAGNOSIS — Z3A23 23 weeks gestation of pregnancy: Secondary | ICD-10-CM

## 2020-03-09 DIAGNOSIS — O99213 Obesity complicating pregnancy, third trimester: Secondary | ICD-10-CM

## 2020-03-09 DIAGNOSIS — Z3400 Encounter for supervision of normal first pregnancy, unspecified trimester: Secondary | ICD-10-CM

## 2020-03-09 DIAGNOSIS — O99212 Obesity complicating pregnancy, second trimester: Secondary | ICD-10-CM

## 2020-03-09 DIAGNOSIS — Z363 Encounter for antenatal screening for malformations: Secondary | ICD-10-CM

## 2020-03-19 ENCOUNTER — Emergency Department (HOSPITAL_COMMUNITY)
Admission: EM | Admit: 2020-03-19 | Discharge: 2020-03-20 | Disposition: A | Discharge status: Home / Self Care | Payer: Medicaid Other | Attending: Emergency Medicine | Admitting: Emergency Medicine

## 2020-03-19 ENCOUNTER — Other Ambulatory Visit: Payer: Self-pay

## 2020-03-19 ENCOUNTER — Encounter (HOSPITAL_COMMUNITY): Payer: Self-pay | Admitting: *Deleted

## 2020-03-19 DIAGNOSIS — R030 Elevated blood-pressure reading, without diagnosis of hypertension: Secondary | ICD-10-CM

## 2020-03-19 DIAGNOSIS — Z3A24 24 weeks gestation of pregnancy: Secondary | ICD-10-CM | POA: Diagnosis not present

## 2020-03-19 DIAGNOSIS — R110 Nausea: Secondary | ICD-10-CM | POA: Insufficient documentation

## 2020-03-19 DIAGNOSIS — O99892 Other specified diseases and conditions complicating childbirth: Secondary | ICD-10-CM | POA: Diagnosis not present

## 2020-03-19 DIAGNOSIS — R600 Localized edema: Secondary | ICD-10-CM | POA: Diagnosis not present

## 2020-03-19 LAB — URINALYSIS, ROUTINE W REFLEX MICROSCOPIC
Bilirubin Urine: NEGATIVE
Glucose, UA: NEGATIVE mg/dL
Hgb urine dipstick: NEGATIVE
Ketones, ur: 20 mg/dL — AB
Leukocytes,Ua: NEGATIVE
Nitrite: NEGATIVE
Protein, ur: NEGATIVE mg/dL
Specific Gravity, Urine: 1.016 (ref 1.005–1.030)
pH: 6 (ref 5.0–8.0)

## 2020-03-19 LAB — COMPREHENSIVE METABOLIC PANEL
ALT: 10 U/L (ref 0–44)
AST: 13 U/L — ABNORMAL LOW (ref 15–41)
Albumin: 3.5 g/dL (ref 3.5–5.0)
Alkaline Phosphatase: 84 U/L (ref 38–126)
Anion gap: 9 (ref 5–15)
BUN: 5 mg/dL — ABNORMAL LOW (ref 6–20)
CO2: 22 mmol/L (ref 22–32)
Calcium: 9.2 mg/dL (ref 8.9–10.3)
Chloride: 103 mmol/L (ref 98–111)
Creatinine, Ser: 0.47 mg/dL (ref 0.44–1.00)
GFR calc Af Amer: >60 mL/min (ref >60)
GFR calc non Af Amer: >60 mL/min (ref >60)
Glucose, Bld: 87 mg/dL (ref 70–99)
Potassium: 3.9 mmol/L (ref 3.5–5.1)
Sodium: 134 mmol/L — ABNORMAL LOW (ref 135–145)
Total Bilirubin: 0.4 mg/dL (ref 0.3–1.2)
Total Protein: 7.7 g/dL (ref 6.5–8.1)

## 2020-03-19 LAB — CBC
HCT: 34.3 % — ABNORMAL LOW (ref 36.0–46.0)
Hemoglobin: 11.1 g/dL — ABNORMAL LOW (ref 12.0–15.0)
MCH: 28.2 pg (ref 26.0–34.0)
MCHC: 32.4 g/dL (ref 30.0–36.0)
MCV: 87.3 fL (ref 80.0–100.0)
Platelets: 404 10*3/uL — ABNORMAL HIGH (ref 150–400)
RBC: 3.93 MIL/uL (ref 3.87–5.11)
RDW: 13.8 % (ref 11.5–15.5)
WBC: 10.5 10*3/uL (ref 4.0–10.5)
nRBC: 0 % (ref 0.0–0.2)

## 2020-03-19 LAB — PROTEIN / CREATININE RATIO, URINE
Creatinine, Urine: 189.29 mg/dL
Protein Creatinine Ratio: 0.08 mg/mg{Cre} (ref 0.00–0.15)
Total Protein, Urine: 15 mg/dL

## 2020-03-19 NOTE — ED Provider Notes (Signed)
[facility] EMERGENCY DEPARTMENT Provider Note   CSN: [id number] Arrival date & time: 03/19/20  2056     History Chief Complaint  Patient presents with  . Hypertension    Abigail Perez is a 24 y.o. female with a history of GERD, G1P0, currently [redacted] weeks pregnant presenting for evaluation of elevation in her blood pressure.  She woke this am feeling nauseated along with episodic epigastric sharp (needle like) localized pain which is currently resolved.  She was seen by her ob/gyn last on 7/22 at which time she had elevated blood pressures, but lab testing including cmet and urine testing was normal.  Today she measured her blood pressure twice and obtained 141/87, then 162/91.  She denies vaginal discharge or bleeding, denies headache, vision changes. She does have some ankle edema, but actually better today than other days.   The history is provided by the patient.       Past Medical History:  Diagnosis Date  . GERD (gastroesophageal reflux disease)     Patient Active Problem List   Diagnosis Date Noted  . Obesity affecting pregnancy in second trimester 01/06/2020  . Supervision of normal first pregnancy, antepartum 12/20/2019    Past Surgical History:  Procedure Laterality Date  . NO PAST SURGERIES       OB History    Gravida  1   Para      Term      Preterm      AB      Living        SAB      TAB      Ectopic      Multiple      Live Births              Family History  Problem Relation Age of Onset  . Diabetes Maternal Grandmother   . Hypertension Maternal Grandmother   . Hypertension Mother     Social History   Tobacco Use  . Smoking status: Never Smoker  . Smokeless tobacco: Never Used  Vaping Use  . Vaping Use: Never used  Substance Use Topics  . Alcohol use: No  . Drug use: No    Home Medications Prior to Admission medications   Medication Sig Start Date End Date Taking? Authorizing Provider  aspirin EC 81 MG tablet Take 1  tablet (81 mg total) by mouth daily. Take after 12 weeks for prevention of preeclampsia later in pregnancy 01/06/20  Yes Judeth Horn, NP  cyclobenzaprine (FLEXERIL) 10 MG tablet Take 1 tablet (10 mg total) by mouth every 8 (eight) hours as needed for muscle spasms. 02/18/20  Yes Arita Miss, Rolitta, CNM  fluticasone (FLONASE) 50 MCG/ACT nasal spray Place 2 sprays into both nostrils daily. 02/27/20  Yes Hawks, Christy A, FNP  pantoprazole (PROTONIX) 40 MG tablet Take 1 tablet (40 mg total) by mouth daily. 01/26/20  Yes Judeth Horn, NP  Prenatal Vit-Fe Fumarate-FA (PRENATAL MULTIVITAMIN) TABS tablet Take 1 tablet by mouth daily at 12 noon.   Yes [provider]  benzonatate (TESSALON PERLES) 100 MG capsule Take 1 capsule (100 mg total) by mouth 3 (three) times daily as needed. Patient not taking: Reported on 03/01/2020 02/27/20   Junie Spencer, FNP  Levonorgestrel-Ethinyl Estradiol (ASHLYNA) 0.15-0.03 &0.01 MG tablet Take 1 tablet by mouth daily.  11/06/19  [provider]  omeprazole (PRILOSEC) 20 MG capsule Take 1 capsule (20 mg total) by mouth daily. Patient not taking: Reported on 11/06/2019 05/29/15 11/06/19  Palumbo,  April, MD    Allergies    Patient has no known allergies.  Review of Systems   Review of Systems  Constitutional: Negative for chills and fever.  HENT: Negative for congestion and sore throat.   Eyes: Negative.  Negative for visual disturbance.  Respiratory: Negative for chest tightness and shortness of breath.   Cardiovascular: Negative for chest pain.  Gastrointestinal: Positive for nausea. Negative for abdominal pain.  Genitourinary: Negative.   Musculoskeletal: Negative for arthralgias, joint swelling and neck pain.  Skin: Negative.  Negative for rash and wound.  Neurological: Negative for dizziness, weakness, light-headedness, numbness and headaches.  Psychiatric/Behavioral: Negative.     Physical Exam Updated Vital Signs BP 129/82 (BP Location:  Right Arm)   Pulse 95   Temp 99 F (37.2 C) (Oral)   Resp 20   LMP 09/09/2019   SpO2 100%   Physical Exam Vitals and nursing note reviewed.  Constitutional:      Appearance: She is well-developed.  HENT:     Head: Normocephalic and atraumatic.  Eyes:     Conjunctiva/sclera: Conjunctivae normal.  Cardiovascular:     Rate and Rhythm: Normal rate and regular rhythm.     Heart sounds: Normal heart sounds.  Pulmonary:     Effort: Pulmonary effort is normal.     Breath sounds: Normal breath sounds. No wheezing.  Abdominal:     General: Bowel sounds are normal.     Palpations: Abdomen is soft.     Tenderness: There is no abdominal tenderness.  Musculoskeletal:        General: Normal range of motion.     Cervical back: Normal range of motion.     Comments: Trace ankle edema.    Skin:    General: Skin is warm and dry.  Neurological:     Mental Status: She is alert.     ED Results / Procedures / Treatments   Labs (all labs ordered are listed, but only abnormal results are displayed) Labs Reviewed  CBC - Abnormal; Notable for the following components:      Result Value   Hemoglobin 11.1 (*)    HCT 34.3 (*)    Platelets 404 (*)    All other components within normal limits  URINALYSIS, ROUTINE W REFLEX MICROSCOPIC - Abnormal; Notable for the following components:   APPearance HAZY (*)    Ketones, ur 20 (*)    All other components within normal limits  COMPREHENSIVE METABOLIC PANEL - Abnormal; Notable for the following components:   Sodium 134 (*)    BUN 5 (*)    AST 13 (*)    All other components within normal limits  PROTEIN / CREATININE RATIO, URINE    EKG None  Radiology No results found.  Procedures Procedures (including critical care time)  Medications Ordered in ED Medications - No data to display  ED Course  I have reviewed the triage vital signs and the nursing notes.  Pertinent labs & imaging results that were available during my care of the  patient were reviewed by me and considered in my medical decision making (see chart for details).    MDM Rules/Calculators/A&P                          Patient presented with 2 elevated blood pressure readings, currently [redacted] weeks gestation, labs this evening are reassuring along with multiple blood pressure checks here.  She was also monitored on the tocometer via  OB RN with reassuring findings.  Dr. Mayford Knife with obgyn requested  f/u with her ob this Thurs or Friday - she knows to call for appt.  Also requested she complete a 24 hour urine collection with plans to returned to [facility] or her ob clinic.  Pt provided with equipment for this.  Final Clinical Impression(s) / ED Diagnoses Final diagnoses:  Elevated blood pressure reading  [redacted] weeks gestation of pregnancy    Rx / DC Orders ED Discharge Orders    None       Victoriano Lain 03/20/20 Virginia Rochester, MD 03/20/20 [phone]

## 2020-03-19 NOTE — ED Notes (Signed)
Patient placed on Toco anf FH monitor. OBRR RN Baxter Hire notified. Orders placed per protocols.

## 2020-03-19 NOTE — ED Triage Notes (Signed)
Pt states that her BP has been elevated for about 2 days. Today it was 141/87 and 162/91. Pt says this morning she woke up feeling nauseated, decreased appetite, midline abdominal pain.   Pt is 24 weeks, due date NOV 23, see Dawson with [facility], last saw them about 2 weeks ago. Says her at her last appointment her BP "was high", and being monitored for potential preeclampsia. Good fetal movement, no vaginal discharge or bleeding. Denies current headache or visual changes.

## 2020-03-19 NOTE — ED Notes (Signed)
TOCO monitor ok to d/c per OB RR RN Baxter Hire. Dr. Mayford Knife wants 24 hour urine ordered (supplies to be sent home with patient and can be returned to [facility] or office). OK to d/c patient from Emergency department after UA/Urine protein creatinine ratio is collected. Patient to follow up in [facility] office Thursday or Friday.

## 2020-03-19 NOTE — Progress Notes (Signed)
OB RR RN called to remotely assess G1P0 at [redacted]w[redacted]d presenting to [facility] ED with high BP.  Reports last seen in office 2 wks ago.  Not on oral medications for BP, just 81 mg aspirin daily.  No HA, vision changes, or swelling.  Last BP 120/75. Primary RN states they are drawing CMP and CBC, will do urine PCR as well.  On monitor right now, baseline 145.  2240 - Called Dr. Mayford Knife, would like pt sent home with 24 h urine and to follow up in office on Thursday or Friday. Make sure pt hydrates enough to do urine PCR. Called primary RN to update.

## 2020-03-20 NOTE — Discharge Instructions (Addendum)
Your blood tests and your blood pressure readings tonight are reassuring.  Your urine test is pending but can be checked by your provider at your visit this week. Call for an office visit on Thursday or Friday.   She also wants you to collect your urine for the next 24 hours and return it here to our lab or to your obgyn clinic once you have completed this timed test.  This will measure for protein over time in your urine.

## 2020-03-21 ENCOUNTER — Telehealth: Payer: Self-pay | Admitting: General Practice

## 2020-03-21 NOTE — Telephone Encounter (Signed)
Called patient to schedule follow up visit for tomorrow.  Unable to leave a message on VM due to mailbox not set up.  Mychart message sent to patient.

## 2020-03-29 ENCOUNTER — Encounter: Payer: Self-pay | Admitting: Obstetrics and Gynecology

## 2020-03-29 ENCOUNTER — Telehealth (INDEPENDENT_AMBULATORY_CARE_PROVIDER_SITE_OTHER): Payer: Medicaid Other | Admitting: Obstetrics and Gynecology

## 2020-03-29 VITALS — BP 124/74 | HR 68 | Wt 275.4 lb

## 2020-03-29 DIAGNOSIS — E669 Obesity, unspecified: Secondary | ICD-10-CM

## 2020-03-29 DIAGNOSIS — Z3A26 26 weeks gestation of pregnancy: Secondary | ICD-10-CM

## 2020-03-29 DIAGNOSIS — O99212 Obesity complicating pregnancy, second trimester: Secondary | ICD-10-CM

## 2020-03-29 DIAGNOSIS — Z3400 Encounter for supervision of normal first pregnancy, unspecified trimester: Secondary | ICD-10-CM

## 2020-03-29 NOTE — Progress Notes (Signed)
   MY CHART VIDEO VIRTUAL OBSTETRICS VISIT ENCOUNTER NOTE  I connected with Abigail Perez on 04/02/20 at 10:30 AM EDT by My Chart video at home and verified that I am speaking with the correct person using two identifiers. Provider located at [employer] for [employer] at [place].   I discussed the limitations, risks, security and privacy concerns of performing an evaluation and management service by My Chart video and the availability of in person appointments. I also discussed with the patient that there may be a patient responsible charge related to this service. The patient expressed understanding and agreed to proceed.  Subjective:  Abigail Perez is a 24 y.o. G1P0 at [redacted]w[redacted]d being followed for ongoing prenatal care.  She is currently monitored for the following issues for this low-risk pregnancy and has Supervision of normal first pregnancy, antepartum and Obesity affecting pregnancy in second trimester on their problem list.  Patient reports headaches have stopped, but "whooshing/heartbeat sound" in ears is still going on. She states the neurology office will not be able to see her until 05/08/2020. She does not want to cancel that appointment in case the issue with her ears is still related to headaches.. Reports fetal movement. Denies any contractions, bleeding or leaking of fluid.   The following portions of the patient's history were reviewed and updated as appropriate: allergies, current medications, past family history, past medical history, past social history, past surgical history and problem list.   Objective:   General:  Alert, oriented and cooperative.   Mental Status: Normal mood and affect perceived. Normal judgment and thought content.  Rest of physical exam deferred due to type of encounter  Vitals:   03/29/20 1100 03/29/20 1110  BP: (!) 157/83 124/74  Pulse: 68   **Done by patient's own at home BP cuff and scale - 275.4 lbs  Assessment and Plan:  Pregnancy:  G1P0 at [redacted]w[redacted]d  1. Obesity affecting pregnancy in second trimester - Taking daily bASA  2. Supervision of normal first pregnancy, antepartum - Advised to keep appt with neurology on 05/08/20 - Anticipatory guidance for 2 hr GTT and needing to fast after midnight the night before her appt  3. [redacted] weeks gestation of pregnancy   Preterm labor symptoms and general obstetric precautions including but not limited to vaginal bleeding, contractions, leaking of fluid and fetal movement were reviewed in detail with the patient.  I discussed the assessment and treatment plan with the patient. The patient was provided an opportunity to ask questions and all were answered. The patient agreed with the plan and demonstrated an understanding of the instructions. The patient was advised to call back or seek an in-person office evaluation/go to [facility] at [facility] for any urgent or concerning symptoms. Please refer to After Visit Summary for other counseling recommendations.   I provided 10 minutes of non-face-to-face time during this encounter. There was 5 minutes of chart review time spent prior to this encounter. Total time spent = 15 minutes.  Return in about 2 weeks (around 04/12/2020) for Return OB 2hr GTT.  Future Appointments  Date Time Provider Department Center  04/11/2020  8:30 AM Raelyn Mora, CNM [facility] None  04/13/2020 12:45 PM [facility] US[facility]  05/01/2020 11:30 AM Anson Fret, MD [facility] None    Raelyn Mora, CNM Center for [employer], [facility]

## 2020-04-02 ENCOUNTER — Encounter: Payer: Self-pay | Admitting: Obstetrics and Gynecology

## 2020-04-11 ENCOUNTER — Other Ambulatory Visit: Payer: Self-pay

## 2020-04-11 ENCOUNTER — Ambulatory Visit (INDEPENDENT_AMBULATORY_CARE_PROVIDER_SITE_OTHER): Payer: Medicaid Other | Admitting: Obstetrics and Gynecology

## 2020-04-11 ENCOUNTER — Encounter: Payer: Self-pay | Admitting: Obstetrics and Gynecology

## 2020-04-11 ENCOUNTER — Encounter: Payer: Self-pay | Admitting: General Practice

## 2020-04-11 VITALS — BP 111/75 | HR 90 | Wt 275.4 lb

## 2020-04-11 DIAGNOSIS — Z3400 Encounter for supervision of normal first pregnancy, unspecified trimester: Secondary | ICD-10-CM | POA: Diagnosis not present

## 2020-04-11 DIAGNOSIS — H9319 Tinnitus, unspecified ear: Secondary | ICD-10-CM

## 2020-04-11 DIAGNOSIS — O99212 Obesity complicating pregnancy, second trimester: Secondary | ICD-10-CM

## 2020-04-11 DIAGNOSIS — Z3A28 28 weeks gestation of pregnancy: Secondary | ICD-10-CM

## 2020-04-11 NOTE — Progress Notes (Signed)
   LOW-RISK PREGNANCY OFFICE VISIT Patient name: Abigail Perez MRN [id number]  Date of birth: 1996-04-15 Chief Complaint:   Routine Prenatal Visit  History of Present Illness:   Abigail Perez is a 24 y.o. G1P0 female at [redacted]w[redacted]d with an Estimated Date of Delivery: 07/03/20 being seen today for ongoing management of a low-risk pregnancy.  Today she reports still having buzzing in ears, but H/A has not returned. Contractions: Not present. Vag. Bleeding: None.  Movement: Present. denies leaking of fluid. Review of Systems:   Pertinent items are noted in HPI Denies abnormal vaginal discharge w/ itching/odor/irritation, headaches, visual changes, shortness of breath, chest pain, abdominal pain, severe nausea/vomiting, or problems with urination or bowel movements unless otherwise stated above. Pertinent History Reviewed:  Reviewed past medical,surgical, social, obstetrical and family history.  Reviewed problem list, medications and allergies. Physical Assessment:   Vitals:   04/11/20 0852  BP: 111/75  Pulse: 90  Weight: 275 lb 6.4 oz (124.9 kg)  Body mass index is 43.13 kg/m.        Physical Examination:   General appearance: Well appearing, and in no distress  Mental status: Alert, oriented to person, place, and time  Skin: Warm & dry  Cardiovascular: Normal heart rate noted  Respiratory: Normal respiratory effort, no distress  Abdomen: Soft, gravid, nontender  Pelvic: Cervical exam deferred         Extremities: Edema: None  Fetal Status: Fetal Heart Rate (bpm): 144 Fundal Height: 32 cm Movement: Present Presentation: Undeterminable  No results found for this or any previous visit (from the past 24 hour(s)).  Assessment & Plan:  1) Low-risk pregnancy G1P0 at [redacted]w[redacted]d with an Estimated Date of Delivery: 07/03/20   2) Supervision of normal first pregnancy, antepartum  - HIV Antibody (routine testing w rflx), RPR, CBC  3) Obesity affecting pregnancy in second trimester - U/S @ 28  wks scheduled for 9/3/20201  4) Ear noise/buzzing, unspecified laterality - Advised to keep neurology appt on 05/08/2020   5) [redacted] weeks gestation of pregnancy - Glucose Tolerance, 2 Hours w/1 Hour - TdaP given 10/2019  Meds: No orders of the defined types were placed in this encounter.  Labs/procedures today: 2 hr GTT and 3rd trimester labs  Plan:  Continue routine obstetrical care   Reviewed: Preterm labor symptoms and general obstetric precautions including but not limited to vaginal bleeding, contractions, leaking of fluid and fetal movement were reviewed in detail with the patient.  All questions were answered. Has home bp cuff. Check bp weekly, let us know if >140/90.   Follow-up: Return in about 4 weeks (around 05/09/2020) for Return OB - My Chart video.  Orders Placed This Encounter  Procedures  . Glucose Tolerance, 2 Hours w/1 Hour  . HIV Antibody (routine testing w rflx)  . RPR  . CBC   Raelyn Mora MSN, CNM 04/11/2020

## 2020-04-11 NOTE — Patient Instructions (Signed)
Fetal Movement Counts Patient Name: ________________________________________________ Patient Due Date: ____________________ What is a fetal movement count?  A fetal movement count is the number of times that you feel your baby move during a certain amount of time. This may also be called a fetal kick count. A fetal movement count is recommended for every pregnant woman. You may be asked to start counting fetal movements as early as week 28 of your pregnancy. Pay attention to when your baby is most active. You may notice your baby's sleep and wake cycles. You may also notice things that make your baby move more. You should do a fetal movement count:  When your baby is normally most active.  At the same time each day. A good time to count movements is while you are resting, after having something to eat and drink. How do I count fetal movements? 1. Find a quiet, comfortable area. Sit, or lie down on your side. 2. Write down the date, the start time and stop time, and the number of movements that you felt between those two times. Take this information with you to your health care visits. 3. Write down your start time when you feel the first movement. 4. Count kicks, flutters, swishes, rolls, and jabs. You should feel at least 10 movements. 5. You may stop counting after you have felt 10 movements, or if you have been counting for 2 hours. Write down the stop time. 6. If you do not feel 10 movements in 2 hours, contact your health care provider for further instructions. Your health care provider may want to do additional tests to assess your baby's well-being. Contact a health care provider if:  You feel fewer than 10 movements in 2 hours.  Your baby is not moving like he or she usually does. Date: ____________ Start time: ____________ Stop time: ____________ Movements: ____________ Date: ____________ Start time: ____________ Stop time: ____________ Movements: ____________ Date: ____________  Start time: ____________ Stop time: ____________ Movements: ____________ Date: ____________ Start time: ____________ Stop time: ____________ Movements: ____________ Date: ____________ Start time: ____________ Stop time: ____________ Movements: ____________ Date: ____________ Start time: ____________ Stop time: ____________ Movements: ____________ Date: ____________ Start time: ____________ Stop time: ____________ Movements: ____________ Date: ____________ Start time: ____________ Stop time: ____________ Movements: ____________ Date: ____________ Start time: ____________ Stop time: ____________ Movements: ____________ This information is not intended to replace advice given to you by your health care provider. Make sure you discuss any questions you have with your health care provider. Document Revised: 03/17/2019 Document Reviewed: 03/17/2019 Elsevier Patient Education  2020 Elsevier Inc. Iron-Rich Diet  Iron is a mineral that helps your body to produce hemoglobin. Hemoglobin is a protein in red blood cells that carries oxygen to your body's tissues. Eating too little iron may cause you to feel weak and tired, and it can increase your risk of infection. Iron is naturally found in many foods, and many foods have iron added to them (iron-fortified foods). You may need to follow an iron-rich diet if you do not have enough iron in your body due to certain medical conditions. The amount of iron that you need each day depends on your age, your sex, and any medical conditions you have. Follow instructions from your health care provider or a diet and nutrition specialist (dietitian) about how much iron you should eat each day. What are tips for following this plan? Reading food labels  Check food labels to see how many milligrams (mg) of iron are in each   serving. Cooking  Cook foods in pots and pans that are made from iron.  Take these steps to make it easier for your body to absorb iron from certain  foods: ? Soak beans overnight before cooking. ? Soak whole grains overnight and drain them before using. ? Ferment flours before baking, such as by using yeast in bread dough. Meal planning  When you eat foods that contain iron, you should eat them with foods that are high in vitamin C. These include oranges, peppers, tomatoes, potatoes, and mango. Vitamin C helps your body to absorb iron. General information  Take iron supplements only as told by your health care provider. An overdose of iron can be life-threatening. If you were prescribed iron supplements, take them with orange juice or a vitamin C supplement.  When you eat iron-fortified foods or take an iron supplement, you should also eat foods that naturally contain iron, such as meat, poultry, and fish. Eating naturally iron-rich foods helps your body to absorb the iron that is added to other foods or contained in a supplement.  Certain foods and drinks prevent your body from absorbing iron properly. Avoid eating these foods in the same meal as iron-rich foods or with iron supplements. These foods include: ? Coffee, black tea, and red wine. ? Milk, dairy products, and foods that are high in calcium. ? Beans and soybeans. ? Whole grains. What foods should I eat? Fruits Prunes. Raisins. Eat fruits high in vitamin C, such as oranges, grapefruits, and strawberries, alongside iron-rich foods. Vegetables Spinach (cooked). Green peas. Broccoli. Fermented vegetables. Eat vegetables high in vitamin C, such as leafy greens, potatoes, bell peppers, and tomatoes, alongside iron-rich foods. Grains Iron-fortified breakfast cereal. Iron-fortified whole-wheat bread. Enriched rice. Sprouted grains. Meats and other proteins Beef liver. Oysters. Beef. Shrimp. Turkey. Chicken. Tuna. Sardines. Chickpeas. Nuts. Tofu. Pumpkin seeds. Beverages Tomato juice. Fresh orange juice. Prune juice. Hibiscus tea. Fortified instant breakfast shakes. Sweets and  desserts Blackstrap molasses. Seasonings and condiments Tahini. Fermented soy sauce. Other foods Wheat germ. The items listed above may not be a complete list of recommended foods and beverages. Contact a dietitian for more information. What foods should I avoid? Grains Whole grains. Bran cereal. Bran flour. Oats. Meats and other proteins Soybeans. Products made from soy protein. Black beans. Lentils. Mung beans. Split peas. Dairy Milk. Cream. Cheese. Yogurt. Cottage cheese. Beverages Coffee. Black tea. Red wine. Sweets and desserts Cocoa. Chocolate. Ice cream. Other foods Basil. Oregano. Large amounts of parsley. The items listed above may not be a complete list of foods and beverages to avoid. Contact a dietitian for more information. Summary  Iron is a mineral that helps your body to produce hemoglobin. Hemoglobin is a protein in red blood cells that carries oxygen to your body's tissues.  Iron is naturally found in many foods, and many foods have iron added to them (iron-fortified foods).  When you eat foods that contain iron, you should eat them with foods that are high in vitamin C. Vitamin C helps your body to absorb iron.  Certain foods and drinks prevent your body from absorbing iron properly, such as whole grains and dairy products. You should avoid eating these foods in the same meal as iron-rich foods or with iron supplements. This information is not intended to replace advice given to you by your health care provider. Make sure you discuss any questions you have with your health care provider. Document Revised: 07/10/2017 Document Reviewed: 06/23/2017 Elsevier Patient Education  2020 Elsevier   Inc.  

## 2020-04-12 LAB — CBC
Hematocrit: 32.3 % — ABNORMAL LOW (ref 34.0–46.6)
Hemoglobin: 10.6 g/dL — ABNORMAL LOW (ref 11.1–15.9)
MCH: 27 pg (ref 26.6–33.0)
MCHC: 32.8 g/dL (ref 31.5–35.7)
MCV: 82 fL (ref 79–97)
Platelets: 416 10*3/uL (ref 150–450)
RBC: 3.92 x10E6/uL (ref 3.77–5.28)
RDW: 13.6 % (ref 11.7–15.4)
WBC: 11.2 10*3/uL — ABNORMAL HIGH (ref 3.4–10.8)

## 2020-04-12 LAB — HIV ANTIBODY (ROUTINE TESTING W REFLEX): HIV Screen 4th Generation wRfx: NONREACTIVE

## 2020-04-12 LAB — RPR: RPR Ser Ql: NONREACTIVE

## 2020-04-12 LAB — GLUCOSE TOLERANCE, 2 HOURS W/ 1HR
Glucose, 1 hour: 119 mg/dL (ref 65–179)
Glucose, 2 hour: 120 mg/dL (ref 65–152)
Glucose, Fasting: 86 mg/dL (ref 65–91)

## 2020-04-13 ENCOUNTER — Ambulatory Visit: Payer: Medicaid Other

## 2020-04-13 ENCOUNTER — Telehealth: Payer: Self-pay | Admitting: *Deleted

## 2020-04-13 DIAGNOSIS — O99019 Anemia complicating pregnancy, unspecified trimester: Secondary | ICD-10-CM

## 2020-04-13 MED ORDER — FERROUS SULFATE 325 (65 FE) MG PO TABS: 325.0000 mg | ORAL_TABLET | ORAL | 3 refills | Status: DC

## 2020-04-13 MED ORDER — ASCORBIC ACID 500 MG PO TABS: 500.0000 mg | ORAL_TABLET | ORAL | 3 refills | Status: DC

## 2020-04-13 NOTE — Telephone Encounter (Signed)
-----   Message from Raelyn Mora, [place] sent at 04/12/2020  4:30 PM EDT ----- Start on iron every other day with vitamin c every other day

## 2020-04-24 ENCOUNTER — Ambulatory Visit: Payer: Medicaid Other | Attending: Maternal & Fetal Medicine

## 2020-04-24 ENCOUNTER — Other Ambulatory Visit: Payer: Self-pay

## 2020-04-24 DIAGNOSIS — Z3A30 30 weeks gestation of pregnancy: Secondary | ICD-10-CM | POA: Diagnosis not present

## 2020-04-24 DIAGNOSIS — E669 Obesity, unspecified: Secondary | ICD-10-CM

## 2020-04-24 DIAGNOSIS — Z362 Encounter for other antenatal screening follow-up: Secondary | ICD-10-CM | POA: Diagnosis not present

## 2020-04-24 DIAGNOSIS — O99213 Obesity complicating pregnancy, third trimester: Secondary | ICD-10-CM | POA: Diagnosis not present

## 2020-05-01 ENCOUNTER — Ambulatory Visit: Payer: Medicaid Other | Admitting: Neurology

## 2020-05-10 ENCOUNTER — Telehealth (INDEPENDENT_AMBULATORY_CARE_PROVIDER_SITE_OTHER): Payer: Medicaid Other | Admitting: Medical

## 2020-05-10 ENCOUNTER — Encounter: Payer: Self-pay | Admitting: Medical

## 2020-05-10 VITALS — BP 130/84 | HR 87

## 2020-05-10 DIAGNOSIS — Z3A32 32 weeks gestation of pregnancy: Secondary | ICD-10-CM

## 2020-05-10 DIAGNOSIS — O99212 Obesity complicating pregnancy, second trimester: Secondary | ICD-10-CM | POA: Diagnosis not present

## 2020-05-10 DIAGNOSIS — Z3400 Encounter for supervision of normal first pregnancy, unspecified trimester: Secondary | ICD-10-CM

## 2020-05-10 DIAGNOSIS — E669 Obesity, unspecified: Secondary | ICD-10-CM

## 2020-05-10 NOTE — Patient Instructions (Signed)
Fetal Movement Counts Patient Name: ________________________________________________ Patient Due Date: ____________________ What is a fetal movement count?  A fetal movement count is the number of times that you feel your baby move during a certain amount of time. This may also be called a fetal kick count. A fetal movement count is recommended for every pregnant woman. You may be asked to start counting fetal movements as early as week 28 of your pregnancy. Pay attention to when your baby is most active. You may notice your baby's sleep and wake cycles. You may also notice things that make your baby move more. You should do a fetal movement count:  When your baby is normally most active.  At the same time each day. A good time to count movements is while you are resting, after having something to eat and drink. How do I count fetal movements? 1. Find a quiet, comfortable area. Sit, or lie down on your side. 2. Write down the date, the start time and stop time, and the number of movements that you felt between those two times. Take this information with you to your health care visits. 3. Write down your start time when you feel the first movement. 4. Count kicks, flutters, swishes, rolls, and jabs. You should feel at least 10 movements. 5. You may stop counting after you have felt 10 movements, or if you have been counting for 2 hours. Write down the stop time. 6. If you do not feel 10 movements in 2 hours, contact your health care provider for further instructions. Your health care provider may want to do additional tests to assess your baby's well-being. Contact a health care provider if:  You feel fewer than 10 movements in 2 hours.  Your baby is not moving like he or she usually does. Date: ____________ Start time: ____________ Stop time: ____________ Movements: ____________ Date: ____________ Start time: ____________ Stop time: ____________ Movements: ____________ Date: ____________  Start time: ____________ Stop time: ____________ Movements: ____________ Date: ____________ Start time: ____________ Stop time: ____________ Movements: ____________ Date: ____________ Start time: ____________ Stop time: ____________ Movements: ____________ Date: ____________ Start time: ____________ Stop time: ____________ Movements: ____________ Date: ____________ Start time: ____________ Stop time: ____________ Movements: ____________ Date: ____________ Start time: ____________ Stop time: ____________ Movements: ____________ Date: ____________ Start time: ____________ Stop time: ____________ Movements: ____________ This information is not intended to replace advice given to you by your health care provider. Make sure you discuss any questions you have with your health care provider. Document Revised: 03/17/2019 Document Reviewed: 03/17/2019 Elsevier Patient Education  2020 Elsevier Inc. Braxton Hicks Contractions Contractions of the uterus can occur throughout pregnancy, but they are not always a sign that you are in labor. You may have practice contractions called Braxton Hicks contractions. These false labor contractions are sometimes confused with true labor. What are Braxton Hicks contractions? Braxton Hicks contractions are tightening movements that occur in the muscles of the uterus before labor. Unlike true labor contractions, these contractions do not result in opening (dilation) and thinning of the cervix. Toward the end of pregnancy (32-34 weeks), Braxton Hicks contractions can happen more often and may become stronger. These contractions are sometimes difficult to tell apart from true labor because they can be very uncomfortable. You should not feel embarrassed if you go to the hospital with false labor. Sometimes, the only way to tell if you are in true labor is for your health care provider to look for changes in the cervix. The health care provider   will do a physical exam and may  monitor your contractions. If you are not in true labor, the exam should show that your cervix is not dilating and your water has not broken. If there are no other health problems associated with your pregnancy, it is completely safe for you to be sent home with false labor. You may continue to have Braxton Hicks contractions until you go into true labor. How to tell the difference between true labor and false labor True labor  Contractions last 30-70 seconds.  Contractions become very regular.  Discomfort is usually felt in the top of the uterus, and it spreads to the lower abdomen and low back.  Contractions do not go away with walking.  Contractions usually become more intense and increase in frequency.  The cervix dilates and gets thinner. False labor  Contractions are usually shorter and not as strong as true labor contractions.  Contractions are usually irregular.  Contractions are often felt in the front of the lower abdomen and in the groin.  Contractions may go away when you walk around or change positions while lying down.  Contractions get weaker and are shorter-lasting as time goes on.  The cervix usually does not dilate or become thin. Follow these instructions at home:   Take over-the-counter and prescription medicines only as told by your health care provider.  Keep up with your usual exercises and follow other instructions from your health care provider.  Eat and drink lightly if you think you are going into labor.  If Braxton Hicks contractions are making you uncomfortable: ? Change your position from lying down or resting to walking, or change from walking to resting. ? Sit and rest in a tub of warm water. ? Drink enough fluid to keep your urine pale yellow. Dehydration may cause these contractions. ? Do slow and deep breathing several times an hour.  Keep all follow-up prenatal visits as told by your health care provider. This is important. Contact a  health care provider if:  You have a fever.  You have continuous pain in your abdomen. Get help right away if:  Your contractions become stronger, more regular, and closer together.  You have fluid leaking or gushing from your vagina.  You pass blood-tinged mucus (bloody show).  You have bleeding from your vagina.  You have low back pain that you never had before.  You feel your baby's head pushing down and causing pelvic pressure.  Your baby is not moving inside you as much as it used to. Summary  Contractions that occur before labor are called Braxton Hicks contractions, false labor, or practice contractions.  Braxton Hicks contractions are usually shorter, weaker, farther apart, and less regular than true labor contractions. True labor contractions usually become progressively stronger and regular, and they become more frequent.  Manage discomfort from Braxton Hicks contractions by changing position, resting in a warm bath, drinking plenty of water, or practicing deep breathing. This information is not intended to replace advice given to you by your health care provider. Make sure you discuss any questions you have with your health care provider. Document Revised: 07/10/2017 Document Reviewed: 12/11/2016 Elsevier Patient Education  2020 Elsevier Inc.  

## 2020-05-10 NOTE — Progress Notes (Signed)
I connected with Abigail Perez 05/10/20 at  4:10 PM EDT by: MyChart video and verified that I am speaking with the correct person using two identifiers.  Patient is located at home and provider is located at [facility].     The purpose of this virtual visit is to provide medical care while limiting exposure to the novel coronavirus. I discussed the limitations, risks, security and privacy concerns of performing an evaluation and management service by MyChart video and the availability of in person appointments. I also discussed with the patient that there may be a patient responsible charge related to this service. By engaging in this virtual visit, you consent to the provision of healthcare.  Additionally, you authorize for your insurance to be billed for the services provided during this visit.  The patient expressed understanding and agreed to proceed.  The following staff members participated in the virtual visit:  Dorisann Frames, RN    PRENATAL VISIT NOTE  Subjective:  Abigail Perez is a 24 y.o. G1P0 at [redacted]w[redacted]d  for phone visit for ongoing prenatal care.  She is currently monitored for the following issues for this low-risk pregnancy and has Supervision of normal first pregnancy, antepartum and Obesity affecting pregnancy in second trimester on their problem list.  Patient reports no complaints.  Contractions: Not present. Vag. Bleeding: None.  Movement: Present. Denies leaking of fluid.   The following portions of the patient's history were reviewed and updated as appropriate: allergies, current medications, past family history, past medical history, past social history, past surgical history and problem list.   Objective:   Vitals:   05/10/20 1606  BP: 130/84  Pulse: 87   Self-Obtained  Fetal Status:     Movement: Present     Assessment and Plan:  Pregnancy: G1P0 at [redacted]w[redacted]d 1. Obesity affecting pregnancy in second trimester - Taking BASA - Last [place] 9/14 EFW 67%, per MFM no  further [place] needed, did discuss possible 36 week [place] due to maternal obesity for growth prior to delivery   2. Supervision of normal first pregnancy, antepartum - Discussed plan for contraception, patient unlikely to start immediately postpartum - Planning to use [employer] for Peds - Normal third trimester labs discussed  - Taking iron  - Anticipatory guidance for future visits discussed  3. [redacted] weeks gestation of pregnancy  Preterm labor symptoms and general obstetric precautions including but not limited to vaginal bleeding, contractions, leaking of fluid and fetal movement were reviewed in detail with the patient.  Return in about 2 weeks (around 05/24/2020) for LOB, Virtual.  No future appointments.   Time spent on virtual visit: 15 minutes  Vonzella Nipple, PA-C

## 2020-05-11 ENCOUNTER — Other Ambulatory Visit: Payer: Self-pay | Admitting: Student

## 2020-05-11 DIAGNOSIS — R12 Heartburn: Secondary | ICD-10-CM

## 2020-05-12 ENCOUNTER — Encounter: Payer: Self-pay | Admitting: Student

## 2020-05-14 ENCOUNTER — Other Ambulatory Visit: Payer: Self-pay | Admitting: Lactation Services

## 2020-05-14 MED ORDER — PANTOPRAZOLE SODIUM 40 MG PO TBEC
40.0000 mg | DELAYED_RELEASE_TABLET | Freq: Every day | ORAL | 1 refills | Status: DC
Start: 2020-05-14 — End: 2020-05-16

## 2020-05-14 NOTE — Progress Notes (Signed)
Protonix refilled at patients request.

## 2020-05-16 ENCOUNTER — Other Ambulatory Visit: Payer: Self-pay | Admitting: *Deleted

## 2020-05-16 ENCOUNTER — Other Ambulatory Visit: Payer: Self-pay | Admitting: Student

## 2020-05-16 DIAGNOSIS — R12 Heartburn: Secondary | ICD-10-CM

## 2020-05-16 MED ORDER — PANTOPRAZOLE SODIUM 40 MG PO TBEC: 40.0000 mg | DELAYED_RELEASE_TABLET | Freq: Every day | ORAL | 1 refills | Status: DC

## 2020-05-16 NOTE — Progress Notes (Signed)
Patient requested Protonix Rx sent to [facility] in [place]. Medication sent.  Clovis Pu, RN

## 2020-05-28 ENCOUNTER — Encounter: Payer: Self-pay | Admitting: General Practice

## 2020-05-30 ENCOUNTER — Telehealth: Payer: Medicaid Other | Admitting: Certified Nurse Midwife

## 2020-06-06 ENCOUNTER — Encounter: Payer: Medicaid Other | Admitting: Certified Nurse Midwife

## 2020-06-07 ENCOUNTER — Encounter: Payer: Medicaid Other | Admitting: Obstetrics and Gynecology

## 2020-06-21 ENCOUNTER — Other Ambulatory Visit (HOSPITAL_COMMUNITY)
Admission: RE | Admit: 2020-06-21 | Discharge: 2020-06-21 | Disposition: A | Discharge status: Home / Self Care | Payer: Medicaid Other | Source: Ambulatory Visit | Attending: Obstetrics and Gynecology | Admitting: Obstetrics and Gynecology

## 2020-06-21 ENCOUNTER — Ambulatory Visit (INDEPENDENT_AMBULATORY_CARE_PROVIDER_SITE_OTHER): Payer: Medicaid Other | Admitting: Obstetrics and Gynecology

## 2020-06-21 ENCOUNTER — Other Ambulatory Visit: Payer: Self-pay

## 2020-06-21 VITALS — BP 108/66 | HR 73 | Temp 98.4°F | Wt 282.4 lb

## 2020-06-21 DIAGNOSIS — Z3A38 38 weeks gestation of pregnancy: Secondary | ICD-10-CM

## 2020-06-21 DIAGNOSIS — O99212 Obesity complicating pregnancy, second trimester: Secondary | ICD-10-CM

## 2020-06-21 DIAGNOSIS — Z3400 Encounter for supervision of normal first pregnancy, unspecified trimester: Secondary | ICD-10-CM

## 2020-06-21 NOTE — Progress Notes (Signed)
   LOW-RISK PREGNANCY OFFICE VISIT Patient name: Abigail Perez MRN [id number]  Date of birth: Apr 01, 1996 Chief Complaint:   Routine Prenatal Visit  History of Present Illness:   Abigail Perez is a 24 y.o. G1P0 female at [redacted]w[redacted]d with an Estimated Date of Delivery: 07/03/20 being seen today for ongoing management of a low-risk pregnancy.  Today she reports no complaints. Contractions: Not present. Vag. Bleeding: None.  Movement: Present. denies leaking of fluid. Review of Systems:   Pertinent items are noted in HPI Denies abnormal vaginal discharge w/ itching/odor/irritation, headaches, visual changes, shortness of breath, chest pain, abdominal pain, severe nausea/vomiting, or problems with urination or bowel movements unless otherwise stated above. Pertinent History Reviewed:  Reviewed past medical,surgical, social, obstetrical and family history.  Reviewed problem list, medications and allergies. Physical Assessment:   Vitals:   06/21/20 0850  BP: 108/66  Pulse: 73  Temp: 98.4 F (36.9 C)  Weight: 282 lb 6.4 oz (128.1 kg)  Body mass index is 44.23 kg/m.        Physical Examination:   General appearance: Well appearing, and in no distress  Mental status: Alert, oriented to person, place, and time  Skin: Warm & dry  Cardiovascular: Normal heart rate noted  Respiratory: Normal respiratory effort, no distress  Abdomen: Soft, gravid, nontender  Pelvic: Cervical exam performed  Dilation: Closed Effacement (%): Thick Station: Ballotable  Extremities: Edema: None  Fetal Status: Fetal Heart Rate (bpm): 135 Fundal Height: 40 cm Movement: Present Presentation: Vertex  No results found for this or any previous visit (from the past 24 hour(s)).  Assessment & Plan:  1) Low-risk pregnancy G1P0 at [redacted]w[redacted]d with an Estimated Date of Delivery: 07/03/20   2) Supervision of normal first pregnancy, antepartum  - Culture, beta strep (group b only),  - Cervicovaginal ancillary only( [facility])  3) Obesity affecting pregnancy in second trimester  4) [redacted] weeks gestation of pregnancy    Meds: No orders of the defined types were placed in this encounter.  Labs/procedures today: GBS, GC/CT and cervical exam  Plan:  Continue routine obstetrical care   Reviewed: Term labor symptoms and general obstetric precautions including but not limited to vaginal bleeding, contractions, leaking of fluid and fetal movement were reviewed in detail with the patient. All questions were answered. Has home bp cuff. Check bp weekly, let us know if >140/90.   Follow-up: Return in about 1 week (around 06/28/2020) for Return OB visit.  Orders Placed This Encounter  Procedures  . Culture, beta strep (group b only)   Raelyn Mora MSN, CNM 06/21/2020 9:21 AM

## 2020-06-21 NOTE — Patient Instructions (Signed)

## 2020-06-22 LAB — CERVICOVAGINAL ANCILLARY ONLY
Bacterial Vaginitis (gardnerella): NEGATIVE
Candida Glabrata: NEGATIVE
Candida Vaginitis: NEGATIVE
Chlamydia: NEGATIVE
Comment: NEGATIVE
Comment: NEGATIVE
Comment: NEGATIVE
Comment: NEGATIVE
Comment: NEGATIVE
Comment: NORMAL
Neisseria Gonorrhea: NEGATIVE
Trichomonas: NEGATIVE

## 2020-06-24 LAB — CULTURE, BETA STREP (GROUP B ONLY): Strep Gp B Culture: POSITIVE — AB

## 2020-06-28 ENCOUNTER — Other Ambulatory Visit (INDEPENDENT_AMBULATORY_CARE_PROVIDER_SITE_OTHER): Payer: Medicaid Other | Admitting: Obstetrics and Gynecology

## 2020-06-28 ENCOUNTER — Other Ambulatory Visit: Payer: Self-pay

## 2020-06-28 ENCOUNTER — Encounter: Payer: Medicaid Other | Admitting: Obstetrics and Gynecology

## 2020-06-28 ENCOUNTER — Telehealth (HOSPITAL_COMMUNITY): Payer: Self-pay | Admitting: *Deleted

## 2020-06-28 ENCOUNTER — Ambulatory Visit (INDEPENDENT_AMBULATORY_CARE_PROVIDER_SITE_OTHER): Payer: Medicaid Other | Admitting: Obstetrics and Gynecology

## 2020-06-28 ENCOUNTER — Encounter (HOSPITAL_COMMUNITY): Payer: Self-pay | Admitting: *Deleted

## 2020-06-28 VITALS — BP 129/80 | HR 94 | Wt 281.4 lb

## 2020-06-28 DIAGNOSIS — Z3400 Encounter for supervision of normal first pregnancy, unspecified trimester: Secondary | ICD-10-CM

## 2020-06-28 DIAGNOSIS — O99212 Obesity complicating pregnancy, second trimester: Secondary | ICD-10-CM

## 2020-06-28 DIAGNOSIS — Z3A39 39 weeks gestation of pregnancy: Secondary | ICD-10-CM

## 2020-06-28 NOTE — Telephone Encounter (Signed)
Preadmission screen  

## 2020-06-29 ENCOUNTER — Encounter: Payer: Self-pay | Admitting: Obstetrics and Gynecology

## 2020-06-29 ENCOUNTER — Other Ambulatory Visit: Payer: Self-pay | Admitting: Obstetrics and Gynecology

## 2020-06-29 NOTE — Progress Notes (Signed)
IOL orders addended

## 2020-06-29 NOTE — Progress Notes (Signed)
   LOW-RISK PREGNANCY OFFICE VISIT Patient name: Abigail Perez MRN [id number]  Date of birth: [birth year] Chief Complaint:   Routine Prenatal Visit  History of Present Illness:   Abigail Perez is a 24 y.o. G1P0 female at [redacted]w[redacted]d with an Estimated Date of Delivery: 07/03/20 being seen today for ongoing management of a low-risk pregnancy.  Today she reports no complaints. Contractions: Not present. Vag. Bleeding: None.  Movement: Present. denies leaking of fluid. Review of Systems:   Pertinent items are noted in HPI Denies abnormal vaginal discharge w/ itching/odor/irritation, headaches, visual changes, shortness of breath, chest pain, abdominal pain, severe nausea/vomiting, or problems with urination or bowel movements unless otherwise stated above. Pertinent History Reviewed:  Reviewed past medical,surgical, social, obstetrical and family history.  Reviewed problem list, medications and allergies. Physical Assessment:   Vitals:   06/28/20 0957  BP: 129/80  Pulse: 94  Weight: 281 lb 6.4 oz (127.6 kg)  Body mass index is 44.07 kg/m.        Physical Examination:   General appearance: Well appearing, and in no distress  Mental status: Alert, oriented to person, place, and time  Skin: Warm & dry  Cardiovascular: Normal heart rate noted  Respiratory: Normal respiratory effort, no distress  Abdomen: Soft, gravid, nontender  Pelvic: Cervical exam performed  Dilation: Closed Effacement (%): Thick Station: Ballotable  Extremities: Edema: None  Fetal Status: Fetal Heart Rate (bpm): 137 Fundal Height: 42 cm Movement: Present Presentation: Vertex  No results found for this or any previous visit (from the past 24 hour(s)).  Assessment & Plan:  1) Low-risk pregnancy G1P0 at [redacted]w[redacted]d with an Estimated Date of Delivery: 07/03/20   2) Supervision of normal first pregnancy, antepartum - IOL scheduled for 41.0 wks - Discussed methods of induction: cytotec, FB, Pitocin, and AROM  3) Obesity  affecting pregnancy in second trimester - Continue to take bASA  4) [redacted] weeks gestation of pregnancy    Meds: No orders of the defined types were placed in this encounter.  Labs/procedures today: cervical exam  Plan:  Continue routine obstetrical care   Reviewed: Term labor symptoms and general obstetric precautions including but not limited to vaginal bleeding, contractions, leaking of fluid and fetal movement were reviewed in detail with the patient.  All questions were answered. Has home bp cuff. Check bp weekly, let us know if >140/90.   Follow-up: Return in about 1 week (around 07/05/2020) for Return OB visit.  No orders of the defined types were placed in this encounter.  Raelyn Mora MSN, CNM 06/28/2020

## 2020-06-30 ENCOUNTER — Other Ambulatory Visit: Payer: Self-pay | Admitting: Advanced Practice Midwife

## 2020-07-04 ENCOUNTER — Encounter: Payer: Medicaid Other | Admitting: Obstetrics and Gynecology

## 2020-07-07 ENCOUNTER — Encounter (HOSPITAL_COMMUNITY): Payer: Self-pay | Admitting: Obstetrics & Gynecology

## 2020-07-07 ENCOUNTER — Inpatient Hospital Stay (HOSPITAL_COMMUNITY)
Admission: AD | Admit: 2020-07-07 | Discharge: 2020-07-10 | DRG: 807 | Disposition: A | Discharge status: Home / Self Care | Payer: Medicaid Other | Attending: Obstetrics & Gynecology | Admitting: Obstetrics & Gynecology

## 2020-07-07 ENCOUNTER — Inpatient Hospital Stay (HOSPITAL_COMMUNITY): Payer: Medicaid Other | Admitting: Anesthesiology

## 2020-07-07 ENCOUNTER — Other Ambulatory Visit: Payer: Self-pay

## 2020-07-07 DIAGNOSIS — O134 Gestational [pregnancy-induced] hypertension without significant proteinuria, complicating childbirth: Principal | ICD-10-CM | POA: Diagnosis present

## 2020-07-07 DIAGNOSIS — O9962 Diseases of the digestive system complicating childbirth: Secondary | ICD-10-CM | POA: Diagnosis present

## 2020-07-07 DIAGNOSIS — K219 Gastro-esophageal reflux disease without esophagitis: Secondary | ICD-10-CM | POA: Diagnosis present

## 2020-07-07 DIAGNOSIS — O480 Post-term pregnancy: Secondary | ICD-10-CM | POA: Diagnosis present

## 2020-07-07 DIAGNOSIS — Z3400 Encounter for supervision of normal first pregnancy, unspecified trimester: Secondary | ICD-10-CM

## 2020-07-07 DIAGNOSIS — Z3A40 40 weeks gestation of pregnancy: Secondary | ICD-10-CM | POA: Diagnosis not present

## 2020-07-07 DIAGNOSIS — O26893 Other specified pregnancy related conditions, third trimester: Secondary | ICD-10-CM | POA: Diagnosis present

## 2020-07-07 DIAGNOSIS — O99824 Streptococcus B carrier state complicating childbirth: Secondary | ICD-10-CM | POA: Diagnosis present

## 2020-07-07 DIAGNOSIS — O99213 Obesity complicating pregnancy, third trimester: Secondary | ICD-10-CM | POA: Diagnosis present

## 2020-07-07 DIAGNOSIS — O99212 Obesity complicating pregnancy, second trimester: Secondary | ICD-10-CM

## 2020-07-07 DIAGNOSIS — O99214 Obesity complicating childbirth: Secondary | ICD-10-CM | POA: Diagnosis present

## 2020-07-07 DIAGNOSIS — Z23 Encounter for immunization: Secondary | ICD-10-CM | POA: Diagnosis not present

## 2020-07-07 DIAGNOSIS — B951 Streptococcus, group B, as the cause of diseases classified elsewhere: Secondary | ICD-10-CM | POA: Diagnosis present

## 2020-07-07 DIAGNOSIS — O133 Gestational [pregnancy-induced] hypertension without significant proteinuria, third trimester: Secondary | ICD-10-CM | POA: Diagnosis present

## 2020-07-07 DIAGNOSIS — Z20822 Contact with and (suspected) exposure to covid-19: Secondary | ICD-10-CM | POA: Diagnosis present

## 2020-07-07 LAB — CBC
HCT: 32.7 % — ABNORMAL LOW (ref 36.0–46.0)
Hemoglobin: 10 g/dL — ABNORMAL LOW (ref 12.0–15.0)
MCH: 24 pg — ABNORMAL LOW (ref 26.0–34.0)
MCHC: 30.6 g/dL (ref 30.0–36.0)
MCV: 78.6 fL — ABNORMAL LOW (ref 80.0–100.0)
Platelets: 402 10*3/uL — ABNORMAL HIGH (ref 150–400)
RBC: 4.16 MIL/uL (ref 3.87–5.11)
RDW: 16 % — ABNORMAL HIGH (ref 11.5–15.5)
WBC: 9 10*3/uL (ref 4.0–10.5)
nRBC: 0 % (ref 0.0–0.2)

## 2020-07-07 LAB — COMPREHENSIVE METABOLIC PANEL
ALT: 8 U/L (ref 0–44)
AST: 14 U/L — ABNORMAL LOW (ref 15–41)
Albumin: 2.9 g/dL — ABNORMAL LOW (ref 3.5–5.0)
Alkaline Phosphatase: 246 U/L — ABNORMAL HIGH (ref 38–126)
Anion gap: 12 (ref 5–15)
BUN: 5 mg/dL — ABNORMAL LOW (ref 6–20)
CO2: 19 mmol/L — ABNORMAL LOW (ref 22–32)
Calcium: 9.2 mg/dL (ref 8.9–10.3)
Chloride: 104 mmol/L (ref 98–111)
Creatinine, Ser: 0.5 mg/dL (ref 0.44–1.00)
GFR, Estimated: >60 mL/min (ref >60)
Glucose, Bld: 85 mg/dL (ref 70–99)
Potassium: 3.9 mmol/L (ref 3.5–5.1)
Sodium: 135 mmol/L (ref 135–145)
Total Bilirubin: 0.6 mg/dL (ref 0.3–1.2)
Total Protein: 6.8 g/dL (ref 6.5–8.1)

## 2020-07-07 LAB — RESP PANEL BY RT-PCR (FLU A&B, COVID) ARPGX2
Influenza A by PCR: NEGATIVE
Influenza B by PCR: NEGATIVE
SARS Coronavirus 2 by RT PCR: NEGATIVE

## 2020-07-07 LAB — PROTEIN / CREATININE RATIO, URINE
Creatinine, Urine: 65.82 mg/dL
Protein Creatinine Ratio: 0.26 mg/mg{Cre} — ABNORMAL HIGH (ref 0.00–0.15)
Total Protein, Urine: 17 mg/dL

## 2020-07-07 LAB — TYPE AND SCREEN
ABO/RH(D): O POS
Antibody Screen: NEGATIVE

## 2020-07-07 MED ORDER — OXYTOCIN-SODIUM CHLORIDE 30-0.9 UT/500ML-% IV SOLN
1.0000 m[IU]/min | INTRAVENOUS | Status: DC
  Administered 2020-07-07: 2 m[IU]/min via INTRAVENOUS

## 2020-07-07 MED ORDER — EPHEDRINE 5 MG/ML INJ: 10.0000 mg | INTRAVENOUS | Status: DC | PRN

## 2020-07-07 MED ORDER — LIDOCAINE HCL (PF) 1 % IJ SOLN: 30.0000 mL | INTRAMUSCULAR | Status: DC | PRN

## 2020-07-07 MED ORDER — FENTANYL-BUPIVACAINE-NACL 0.5-0.125-0.9 MG/250ML-% EP SOLN
EPIDURAL | Status: AC
  Filled 2020-07-07: qty 250

## 2020-07-07 MED ORDER — PHENYLEPHRINE 40 MCG/ML (10ML) SYRINGE FOR IV PUSH (FOR BLOOD PRESSURE SUPPORT)
80.0000 ug | PREFILLED_SYRINGE | INTRAVENOUS | Status: DC | PRN
  Filled 2020-07-07: qty 10

## 2020-07-07 MED ORDER — CLINDAMYCIN PHOSPHATE 900 MG/50ML IV SOLN: 900.0000 mg | Freq: Three times a day (TID) | INTRAVENOUS | Status: DC

## 2020-07-07 MED ORDER — MISOPROSTOL 50MCG HALF TABLET: 50.0000 ug | ORAL_TABLET | ORAL | Status: DC | PRN

## 2020-07-07 MED ORDER — ACETAMINOPHEN 325 MG PO TABS: 650.0000 mg | ORAL_TABLET | ORAL | Status: DC | PRN

## 2020-07-07 MED ORDER — LIDOCAINE HCL (PF) 1 % IJ SOLN
INTRAMUSCULAR | Status: DC | PRN
  Administered 2020-07-07: 5 mL via EPIDURAL

## 2020-07-07 MED ORDER — TERBUTALINE SULFATE 1 MG/ML IJ SOLN: 0.2500 mg | Freq: Once | INTRAMUSCULAR | Status: DC | PRN

## 2020-07-07 MED ORDER — LACTATED RINGERS IV SOLN: INTRAVENOUS | Status: DC

## 2020-07-07 MED ORDER — PENICILLIN G POT IN DEXTROSE 60000 UNIT/ML IV SOLN
3.0000 10*6.[IU] | INTRAVENOUS | Status: DC
  Administered 2020-07-07 – 2020-07-08 (×2): 3 10*6.[IU] via INTRAVENOUS
  Filled 2020-07-07 (×2): qty 50

## 2020-07-07 MED ORDER — LABETALOL HCL 5 MG/ML IV SOLN: 40.0000 mg | INTRAVENOUS | Status: DC | PRN

## 2020-07-07 MED ORDER — ONDANSETRON HCL 4 MG/2ML IJ SOLN
4.0000 mg | Freq: Four times a day (QID) | INTRAMUSCULAR | Status: DC | PRN
  Administered 2020-07-08: 4 mg via INTRAVENOUS
  Filled 2020-07-07: qty 2

## 2020-07-07 MED ORDER — SODIUM CHLORIDE 0.9 % IV SOLN
5.0000 10*6.[IU] | Freq: Once | INTRAVENOUS | Status: AC
  Administered 2020-07-07: 5 10*6.[IU] via INTRAVENOUS
  Filled 2020-07-07: qty 5

## 2020-07-07 MED ORDER — LABETALOL HCL 5 MG/ML IV SOLN: 80.0000 mg | INTRAVENOUS | Status: DC | PRN

## 2020-07-07 MED ORDER — DIPHENHYDRAMINE HCL 50 MG/ML IJ SOLN: 12.5000 mg | INTRAMUSCULAR | Status: DC | PRN

## 2020-07-07 MED ORDER — FENTANYL-BUPIVACAINE-NACL 0.5-0.125-0.9 MG/250ML-% EP SOLN: 12.0000 mL/h | EPIDURAL | Status: DC | PRN

## 2020-07-07 MED ORDER — FENTANYL CITRATE (PF) 100 MCG/2ML IJ SOLN: 100.0000 ug | INTRAMUSCULAR | Status: DC | PRN

## 2020-07-07 MED ORDER — OXYTOCIN-SODIUM CHLORIDE 30-0.9 UT/500ML-% IV SOLN
1.0000 m[IU]/min | INTRAVENOUS | Status: DC
  Filled 2020-07-07: qty 500

## 2020-07-07 MED ORDER — PHENYLEPHRINE 40 MCG/ML (10ML) SYRINGE FOR IV PUSH (FOR BLOOD PRESSURE SUPPORT): 80.0000 ug | PREFILLED_SYRINGE | INTRAVENOUS | Status: DC | PRN

## 2020-07-07 MED ORDER — LACTATED RINGERS IV SOLN: 500.0000 mL | INTRAVENOUS | Status: DC | PRN

## 2020-07-07 MED ORDER — HYDRALAZINE HCL 20 MG/ML IJ SOLN: 10.0000 mg | INTRAMUSCULAR | Status: DC | PRN

## 2020-07-07 MED ORDER — HYDROXYZINE HCL 50 MG PO TABS: 50.0000 mg | ORAL_TABLET | Freq: Four times a day (QID) | ORAL | Status: DC | PRN

## 2020-07-07 MED ORDER — OXYTOCIN BOLUS FROM INFUSION
333.0000 mL | Freq: Once | INTRAVENOUS | Status: AC
  Administered 2020-07-08: 333 mL via INTRAVENOUS

## 2020-07-07 MED ORDER — SODIUM CHLORIDE (PF) 0.9 % IJ SOLN
INTRAMUSCULAR | Status: DC | PRN
  Administered 2020-07-07: 12 mL/h via EPIDURAL

## 2020-07-07 MED ORDER — LACTATED RINGERS IV SOLN
500.0000 mL | Freq: Once | INTRAVENOUS | Status: AC
  Administered 2020-07-07: 500 mL via INTRAVENOUS

## 2020-07-07 MED ORDER — LABETALOL HCL 5 MG/ML IV SOLN: 20.0000 mg | INTRAVENOUS | Status: DC | PRN

## 2020-07-07 NOTE — H&P (Signed)
Obstetrics Attending History and Physical  Abigail Perez is a 24 y.o. G1P0000 with IUP at [redacted]w[redacted]d presenting in early labor.  Also has elevated BPs noted on admission;  denies any headaches, visual symptoms, RUQ/epigastric pain or other concerning symptoms. Of note, she has documented elevated BPs at 22 weeks, 26 weeks and today and meets criteria for at least GHTN.  Patient states she has been having  regular, every 5-6 minutes contractions, no vaginal bleeding, intact membranes, with active fetal movement.    Prenatal Course Source of Care: [facility]  Pregnancy complications or risks: Patient Active Problem List   Diagnosis Date Noted  . Gestational hypertension, third trimester 07/07/2020  . Obesity affecting pregnancy in third trimester 01/06/2020  . Post term pregnancy over 40 weeks 12/20/2019   Nursing Staff Provider  Office Location  [facility] Dating  Early U/S  Language  English Anatomy US  Normal  Flu Vaccine  N/A Genetic Screen  NIPS: Low risk female      TDaP vaccine   10/2019 Hgb A1C or  GTT Early   A1C 5.5 Third trimester Normal  Glucose, Fasting 65 - 91 mg/dL 86   Glucose, 1 hour 65 - 179 mg/dL 299   Glucose, 2 hour 65 - 152 mg/dL 371     Rhogam  N/A   LAB RESULTS     Blood Type O/Positive/-- (05/11 1421)   Feeding Plan Breast Antibody Negative (05/11 1421)  Contraception Probably none right away Rubella 2.40 (05/11 1421)immune  Circumcision yes RPR Non Reactive (05/11 1421)   Pediatrician  [facility] HBsAg Negative (05/11 1421)  Support Person Family HCVAb Negative  Prenatal Classes Info given HIV Non Reactive (05/11 1421)     BTL Consent N/A GBS Positive/-- (11/11 0914)  VBAC Consent N/A Pap 01/06/20, NIL    Hgb Electro   horizon negative  BP Cuff Rx [facility] 12/20/19 CF horizon negative  Weight Scale Rx [facility] 12/20/19 SMA horizon negative     Past Medical History:  Diagnosis Date  . GERD (gastroesophageal reflux disease)      Past Surgical History:  Procedure Laterality Date  . NO PAST SURGERIES      OB History  Gravida Para Term Preterm AB Living  1 0 0 0 0 0  SAB TAB Ectopic Multiple Live Births  0 0 0 0 0    # Outcome Date GA Lbr Len/2nd Weight Sex Delivery Anes PTL Lv  1 Current             Social History   Socioeconomic History  . Marital status: Single    Spouse name: Not on file  . Number of children: Not on file  . Years of education: Not on file  . Highest education level: Bachelor's degree (e.g., BA, AB, BS)  Occupational History  . Not on file  Tobacco Use  . Smoking status: Never Smoker  . Smokeless tobacco: Never Used  Vaping Use  . Vaping Use: Never used  Substance and Sexual Activity  . Alcohol use: No  . Drug use: No  . Sexual activity: Yes    Comment: Junel-last took 11/05/19  Other Topics Concern  . Not on file  Social History Narrative  . Not on file   Social Determinants of Health   Financial Resource Strain:   . Difficulty of Paying Living Expenses: Not on file  Food Insecurity:   . Worried About Programme researcher, broadcasting/film/video in the Last Year: Not on  file  . Ran Out of Food in the Last Year: Not on file  Transportation Needs:   . Lack of Transportation (Medical): Not on file  . Lack of Transportation (Non-Medical): Not on file  Physical Activity:   . Days of Exercise per Week: Not on file  . Minutes of Exercise per Session: Not on file  Stress:   . Feeling of Stress : Not on file  Social Connections:   . Frequency of Communication with Friends and Family: Not on file  . Frequency of Social Gatherings with Friends and Family: Not on file  . Attends Religious Services: Not on file  . Active Member of Clubs or Organizations: Not on file  . Attends Banker Meetings: Not on file  . Marital Status: Not on file    Family History  Problem Relation Age of Onset  . Diabetes Maternal Grandmother   . Hypertension Maternal Grandmother   . Hypertension  Mother     Medications Prior to Admission  Medication Sig Dispense Refill Last Dose  . aspirin EC 81 MG tablet Take 1 tablet (81 mg total) by mouth daily. Take after 12 weeks for prevention of preeclampsia later in pregnancy 30 tablet 5 07/06/2020 at Unknown time  . pantoprazole (PROTONIX) 40 MG tablet Take 1 tablet (40 mg total) by mouth daily. 30 tablet 1 07/06/2020 at Unknown time  . Prenatal Vit-Fe Fumarate-FA (PRENATAL MULTIVITAMIN) TABS tablet Take 1 tablet by mouth daily at 12 noon.    07/06/2020 at Unknown time  . ascorbic acid (VITAMIN C) 500 MG tablet Take 1 tablet (500 mg total) by mouth every other day. Take with Iron Tablet. (Patient not taking: Reported on 06/21/2020) 30 tablet 3   . benzonatate (TESSALON PERLES) 100 MG capsule Take 1 capsule (100 mg total) by mouth 3 (three) times daily as needed. (Patient not taking: Reported on 03/01/2020) 20 capsule 0   . cyclobenzaprine (FLEXERIL) 10 MG tablet Take 1 tablet (10 mg total) by mouth every 8 (eight) hours as needed for muscle spasms. (Patient not taking: Reported on 03/29/2020) 30 tablet 1   . ferrous sulfate (FERROUSUL) 325 (65 FE) MG tablet Take 1 tablet (325 mg total) by mouth every other day. Take with Vitamin C tablet (Patient not taking: Reported on 06/21/2020) 30 tablet 3   . fluticasone (FLONASE) 50 MCG/ACT nasal spray Place 2 sprays into both nostrils daily. (Patient not taking: Reported on 03/29/2020) 16 g 6   . pantoprazole (PROTONIX) 40 MG tablet Take 1 tablet (40 mg total) by mouth daily. (Patient not taking: Reported on 06/21/2020) 30 tablet 2     No Known Allergies  Review of Systems: Negative except for what is mentioned in HPI.  Physical Exam: Patient Vitals for the past 24 hrs:  BP Temp Temp src Pulse Resp SpO2 Height Weight  07/07/20 1416 130/88 -- -- 63 -- -- -- --  07/07/20 1415 132/84 -- -- 78 -- -- -- --  07/07/20 1343 (!) 147/86 -- -- 79 -- -- -- --  07/07/20 1342 (!) 147/86 -- -- 77 18 97 % -- --   07/07/20 1323 (!) 147/80 98.7 F (37.1 C) Oral 74 18 100 % 5\' 7"  (1.702 m) 127.7 kg   CONSTITUTIONAL: Well-developed, well-nourished female in no acute distress.  HENT:  Normocephalic, atraumatic, External right and left ear normal. Oropharynx is clear and moist EYES: Conjunctivae and EOM are normal. Pupils are equal, round, and reactive to light. No scleral icterus.  NECK:  Normal range of motion, supple, no masses SKIN: Skin is warm and dry. No rash noted. Not diaphoretic. No erythema. No pallor. NEUROLOGIC: Alert and oriented to person, place, and time. Normal reflexes, muscle tone coordination. No cranial nerve deficit noted. PSYCHIATRIC: Normal mood and affect. Normal behavior. Normal judgment and thought content. CARDIOVASCULAR: Normal heart rate noted, regular rhythm RESPIRATORY: Effort and breath sounds normal, no problems with respiration noted ABDOMEN: Soft, nontender, nondistended, gravid. MUSCULOSKELETAL: Normal range of motion. No edema and no tenderness. 2+ distal pulses.  Cervical Exam: Dilation: 3 Effacement (%): 70 Station: -1 Presentation: Vertex Exam by:: Leafy Ro, RN    FHT:  Baseline rate 135 bpm   Variability moderate  Accelerations present   Decelerations none Contractions: Every 2-5 mins   Pertinent Labs/Studies:   Results for orders placed or performed during the hospital encounter of 07/07/20 (from the past 24 hour(s))  Type and screen     Status: None (Preliminary result)   Collection Time: 07/07/20  2:14 PM  Result Value Ref Range   ABO/RH(D) PENDING    Antibody Screen PENDING    Sample Expiration      07/10/2020,2359 Performed at [facility], [address], [place], [place] [address]     Assessment : Abigail Perez is a 24 y.o. G1P0000 at [redacted]w[redacted]d being admitted for early labor and gestational hypertension  Plan: GHTN: No severe features. Labs pending. Continue close monitoring. Labor: Augmentation as ordered as per protocol. Analgesia  as needed. Desires epidural. FWB: Reassuring fetal heart tracing, Category 1 presently.  GBS positive, PCN ordered for prophylaxis Delivery plan: Hopeful for vaginal delivery   Jaynie Collins, MD, FACOG Obstetrician & Gynecologist, [facility] for [employer], [facility]

## 2020-07-07 NOTE — Progress Notes (Addendum)
LABOR PROGRESS NOTE  Abigail Perez is a 24 y.o. G1P0000 at [redacted]w[redacted]d  admitted for IOL 2/2 gHTN.   Subjective: Doing well resting comfortably.   Objective: BP 137/88   Pulse 89   Temp 98.4 F (36.9 C) (Oral)   Resp 16   Ht 5\' 7"  (1.702 m)   Wt 127.7 kg   LMP 09/09/2019   SpO2 97%   BMI 44.10 kg/m  or  Vitals:   07/07/20 1740 07/07/20 1833 07/07/20 1901 07/07/20 2000  BP: 131/88 (!) 151/86 129/82 137/88  Pulse: 87 78 82 89  Resp: 17 17 16    Temp:   98.4 F (36.9 C)   TempSrc:   Oral   SpO2:      Weight:      Height:       Dilation: 2.5 Effacement (%): 70 Station: -2 Presentation: Vertex Exam by:: DO FHT: baseline rate 135 bpm, moderate varibility, 15 x 15 acel, no decel Toco: minimal  Labs: Lab Results  Component Value Date   WBC 9.0 07/07/2020   HGB 10.0 (L) 07/07/2020   HCT 32.7 (L) 07/07/2020   MCV 78.6 (L) 07/07/2020   PLT 402 (H) 07/07/2020    Patient Active Problem List   Diagnosis Date Noted  . Gestational hypertension, third trimester 07/07/2020  . Obesity affecting pregnancy in third trimester 01/06/2020  . Post term pregnancy over 40 weeks 12/20/2019    Assessment / Plan: 24 y.o. G1P0000 at [redacted]w[redacted]d here for IOL 2/2 gHTN.   gHTN -Continue to monitor BPs  Labor: Cervix unchanged from last internal os is 4cm, external os is 2 cm. Consider FB/Pitocin Fetal Wellbeing: Cat I Pain Control:  Maternally supported, planning for an epidural Anticipated MOD:  Vaginal  25, DO 07/07/2020, 8:12 PM PGY-2, [facility]

## 2020-07-07 NOTE — MAU Note (Signed)
Having contractions.  Started on Thursday night.  Getting closer and stronger, now every 5-6 min. No bleeding or LOF.  Was closed when last checked.  Baby very active.

## 2020-07-07 NOTE — Anesthesia Procedure Notes (Signed)

## 2020-07-07 NOTE — Progress Notes (Signed)
LABOR PROGRESS NOTE  Abigail Perez is a 24 y.o. G1P0000 at [redacted]w[redacted]d  admitted for IOL 2/2 GHTN.   Subjective: Doing well resting comfortably. Patient denies any headaches, visual symptoms, RUQ/epigastric pain or other concerning symptoms.  Objective: BP 137/88   Pulse 89   Temp 98.4 F (36.9 C) (Oral)   Resp 16   Ht 5\' 7"  (1.702 m)   Wt 127.7 kg   LMP 09/09/2019   SpO2 97%   BMI 44.10 kg/m  or  Vitals:   07/07/20 1740 07/07/20 1833 07/07/20 1901 07/07/20 2000  BP: 131/88 (!) 151/86 129/82 137/88  Pulse: 87 78 82 89  Resp: 17 17 16    Temp:   98.4 F (36.9 C)   TempSrc:   Oral   SpO2:      Weight:      Height:       Dilation: 3 Effacement (%): 80 Station: -2 Presentation: Vertex Exam by:: Dr. FHT: baseline rate 135 bpm, moderate varibility, + accels, no decel Toco: q 4-5 mins  Labs:  CBC Latest Ref Rng & Units 07/07/2020 04/11/2020 03/19/2020  WBC 4.0 - 10.5 K/uL 9.0 11.2(H) 10.5  Hemoglobin 12.0 - 15.0 g/dL 10.0(L) 10.6(L) 11.1(L)  Hematocrit 36 - 46 % 32.7(L) 32.3(L) 34.3(L)  Platelets 150 - 400 K/uL 402(H) 416 404(H)   CMP Latest Ref Rng & Units 07/07/2020 03/19/2020 03/02/2020  Glucose 70 - 99 mg/dL 85 87 89  BUN 6 - 20 mg/dL 5(L) 5(L) 6  Creatinine 0.44 - 1.00 mg/dL 05/19/2020 03/04/2020 4.09)  Sodium 135 - 145 mmol/L 135 134(L) 137  Potassium 3.5 - 5.1 mmol/L 3.9 3.9 3.6  Chloride 98 - 111 mmol/L 104 103 102  CO2 22 - 32 mmol/L 19(L) 22 19(L)  Calcium 8.9 - 10.3 mg/dL 9.2 9.2 9.2  Total Protein 6.5 - 8.1 g/dL 6.8 7.7 6.4  Total Bilirubin 0.3 - 1.2 mg/dL 0.6 0.4 8.11  Alkaline Phos 38 - 126 U/L 246(H) 84 82  AST 15 - 41 U/L 14(L) 13(L) 14  ALT 0 - 44 U/L 8 10 8     Assessment / Plan: 24 y.o. G1P0000 at [redacted]w[redacted]d here for IOL 2/2 GHTN.   Principal Problem:   Post term pregnancy over 40 weeks Active Problems:   Obesity affecting pregnancy in third trimester   Gestational hypertension, third trimester  GHTN: Continue to monitor BPs Labor: Start pitocin per  protocol Fetal Wellbeing: Cat I Pain Control:  None for now, planning for an epidural later Anticipated MOD:  Hopeful for vaginal delivery   , MD 07/07/2020, 8:58 PM

## 2020-07-07 NOTE — Anesthesia Preprocedure Evaluation (Addendum)
Anesthesia Evaluation  Patient identified by MRN, date of birth, ID band Patient awake    Reviewed: Allergy & Precautions, NPO status , Patient's Chart, lab work & pertinent test results  Airway Mallampati: II  TM Distance: >3 FB Neck ROM: Full    Dental no notable dental hx. (+) Teeth Intact, Dental Advisory Given   Pulmonary neg pulmonary ROS,    Pulmonary exam normal breath sounds clear to auscultation       Cardiovascular hypertension, Pt. on medications and Pt. on home beta blockers Normal cardiovascular exam Rhythm:Regular Rate:Normal  gHTN on Labetalol   Neuro/Psych negative neurological ROS  negative psych ROS   GI/Hepatic Neg liver ROS, GERD  ,  Endo/Other  negative endocrine ROS  Renal/GU negative Renal ROS     Musculoskeletal negative musculoskeletal ROS (+)   Abdominal (+) + obese,   Peds  Hematology Lab Results      Component                Value               Date                      WBC                      9.0                 07/07/2020                HGB                      10.0 (L)            07/07/2020                HCT                      32.7 (L)            07/07/2020                MCV                      78.6 (L)            07/07/2020                PLT                      402 (H)             07/07/2020              Anesthesia Other Findings   Reproductive/Obstetrics (+) Pregnancy                           Anesthesia Physical Anesthesia Plan  ASA: III  Anesthesia Plan: Epidural   Post-op Pain Management:    Induction:   PONV Risk Score and Plan:   Airway Management Planned:   Additional Equipment: None  Intra-op Plan:   Post-operative Plan:   Informed Consent: I have reviewed the patients History and Physical, chart, labs and discussed the procedure including the risks, benefits and alternatives for the proposed anesthesia with the patient or  authorized representative who has indicated his/her understanding and acceptance.       Plan Discussed with:  Anesthesia Plan Comments: (40.4 Wk G1P0 w gHTN for LEA)       Anesthesia Quick Evaluation

## 2020-07-08 ENCOUNTER — Encounter (HOSPITAL_COMMUNITY): Payer: Self-pay | Admitting: Obstetrics & Gynecology

## 2020-07-08 DIAGNOSIS — O99824 Streptococcus B carrier state complicating childbirth: Secondary | ICD-10-CM

## 2020-07-08 DIAGNOSIS — Z3A40 40 weeks gestation of pregnancy: Secondary | ICD-10-CM

## 2020-07-08 DIAGNOSIS — B951 Streptococcus, group B, as the cause of diseases classified elsewhere: Secondary | ICD-10-CM | POA: Diagnosis present

## 2020-07-08 DIAGNOSIS — O134 Gestational [pregnancy-induced] hypertension without significant proteinuria, complicating childbirth: Secondary | ICD-10-CM

## 2020-07-08 LAB — RPR: RPR Ser Ql: NONREACTIVE

## 2020-07-08 MED ORDER — ONDANSETRON HCL 4 MG/2ML IJ SOLN: 4.0000 mg | INTRAMUSCULAR | Status: DC | PRN

## 2020-07-08 MED ORDER — DIPHENHYDRAMINE HCL 25 MG PO CAPS: 25.0000 mg | ORAL_CAPSULE | Freq: Four times a day (QID) | ORAL | Status: DC | PRN

## 2020-07-08 MED ORDER — SOD CITRATE-CITRIC ACID 500-334 MG/5ML PO SOLN
30.0000 mL | Freq: Once | ORAL | Status: AC
  Administered 2020-07-08: 30 mL via ORAL

## 2020-07-08 MED ORDER — ACETAMINOPHEN 325 MG PO TABS: 650.0000 mg | ORAL_TABLET | ORAL | Status: DC | PRN

## 2020-07-08 MED ORDER — SIMETHICONE 80 MG PO CHEW: 80.0000 mg | CHEWABLE_TABLET | ORAL | Status: DC | PRN

## 2020-07-08 MED ORDER — INFLUENZA VAC SPLIT QUAD 0.5 ML IM SUSY
0.5000 mL | PREFILLED_SYRINGE | INTRAMUSCULAR | Status: AC
  Administered 2020-07-10: 0.5 mL via INTRAMUSCULAR
  Filled 2020-07-08: qty 0.5

## 2020-07-08 MED ORDER — PRENATAL MULTIVITAMIN CH
1.0000 | ORAL_TABLET | Freq: Every day | ORAL | Status: DC
  Administered 2020-07-08 – 2020-07-10 (×3): 1 via ORAL
  Filled 2020-07-08 (×3): qty 1

## 2020-07-08 MED ORDER — BENZOCAINE-MENTHOL 20-0.5 % EX AERO: 1.0000 "application " | INHALATION_SPRAY | CUTANEOUS | Status: DC | PRN

## 2020-07-08 MED ORDER — ONDANSETRON HCL 4 MG PO TABS: 4.0000 mg | ORAL_TABLET | ORAL | Status: DC | PRN

## 2020-07-08 MED ORDER — TETANUS-DIPHTH-ACELL PERTUSSIS 5-2.5-18.5 LF-MCG/0.5 IM SUSY: 0.5000 mL | PREFILLED_SYRINGE | Freq: Once | INTRAMUSCULAR | Status: DC

## 2020-07-08 MED ORDER — MEASLES, MUMPS & RUBELLA VAC IJ SOLR: 0.5000 mL | Freq: Once | INTRAMUSCULAR | Status: DC

## 2020-07-08 MED ORDER — COCONUT OIL OIL
1.0000 "application " | TOPICAL_OIL | Status: DC | PRN
  Administered 2020-07-10: 1 via TOPICAL

## 2020-07-08 MED ORDER — MAGNESIUM HYDROXIDE 400 MG/5ML PO SUSP: 30.0000 mL | ORAL | Status: DC | PRN

## 2020-07-08 MED ORDER — WITCH HAZEL-GLYCERIN EX PADS: 1.0000 "application " | MEDICATED_PAD | CUTANEOUS | Status: DC | PRN

## 2020-07-08 MED ORDER — DIBUCAINE (PERIANAL) 1 % EX OINT: 1.0000 "application " | TOPICAL_OINTMENT | CUTANEOUS | Status: DC | PRN

## 2020-07-08 MED ORDER — OXYTOCIN-SODIUM CHLORIDE 30-0.9 UT/500ML-% IV SOLN
2.5000 [IU]/h | INTRAVENOUS | Status: DC
  Administered 2020-07-08: 2.5 [IU]/h via INTRAVENOUS

## 2020-07-08 MED ORDER — IBUPROFEN 600 MG PO TABS
600.0000 mg | ORAL_TABLET | Freq: Four times a day (QID) | ORAL | Status: DC
  Administered 2020-07-08 – 2020-07-10 (×9): 600 mg via ORAL
  Filled 2020-07-08 (×9): qty 1

## 2020-07-08 MED ORDER — SENNOSIDES-DOCUSATE SODIUM 8.6-50 MG PO TABS
2.0000 | ORAL_TABLET | ORAL | Status: DC
  Administered 2020-07-08 – 2020-07-10 (×2): 2 via ORAL
  Filled 2020-07-08 (×2): qty 2

## 2020-07-08 NOTE — Anesthesia Postprocedure Evaluation (Signed)
Anesthesia Post Note  Patient: Abigail Perez  Procedure(s) Performed: AN AD HOC LABOR EPIDURAL     Patient location during evaluation: Mother Baby Anesthesia Type: Epidural Level of consciousness: awake and alert, oriented and patient cooperative Pain management: pain level controlled Vital Signs Assessment: post-procedure vital signs reviewed and stable Respiratory status: spontaneous breathing Cardiovascular status: stable Postop Assessment: no headache, epidural receding, patient able to bend at knees and no signs of nausea or vomiting Anesthetic complications: no Comments: Pain score 2.    No complications documented.  Last Vitals:  Vitals:   07/08/20 0545 07/08/20 0640  BP: 126/65 139/85  Pulse: 68 78  Resp: 18 18  Temp:    SpO2: 99% 100%    Last Pain:  Vitals:   07/08/20 0640  TempSrc:   PainSc: 0-No pain   Pain Goal:                   [facility]

## 2020-07-08 NOTE — Progress Notes (Signed)
Patient ID: Abigail Perez, female   DOB: Nov 28, 1995, 24 y.o.   MRN: [id number]  Epidural recently placed; feeling much better; is here for IOL due to gHTN- Pitocin started at 2100  BPs 113/72, 127/74, 137/75 (had some higher ones around the time of epi placement) FHR 135-145, +accels, occ variables Ctx q 3-4 mins with Pit at 74mu/min Cx 3/80/vtx -2 by Dr A ~ 2100  IUP@40 .5 gHTN IOL process  Will continue to titrate Pit to achieve active labor Anticipate vag del  [name] CNM 07/08/2020 12:30 AM

## 2020-07-08 NOTE — Discharge Instructions (Signed)

## 2020-07-08 NOTE — Progress Notes (Signed)
Attempted to call report for patient, RN asked to call back.

## 2020-07-08 NOTE — Discharge Summary (Signed)
Postpartum Discharge Summary     Patient Name: Abigail Perez DOB: 1996-07-11 MRN: [id number]  Date of admission: 07/07/2020 Delivery date:07/08/2020  Delivering provider: Donney Dice  Date of discharge: 07/10/2020  Admitting diagnosis: Post term pregnancy over 40 weeks [O48.0] Intrauterine pregnancy: [redacted]w[redacted]d    Secondary diagnosis:  Principal Problem:   Post term pregnancy over 40 weeks Active Problems:   Obesity affecting pregnancy in third trimester   Gestational hypertension, third trimester   Group beta Strep positive  Additional problems: none    Discharge diagnosis: Term Pregnancy Delivered and Gestational Hypertension                                              Post partum procedures:none Augmentation: Pitocin Complications: None  Hospital course: Induction of Labor With Vaginal Delivery   24y.o. yo G1P0000 at 464w5das admitted to the hospital 07/07/2020 for induction of labor.  Indication for induction: Gestational hypertension.  She initially presented for labor eval, but then was noted to have an elevated BP and had elevated BPs at 22 and 26wks, so was given a dx of gHTN and admitted. Pre-e labs were neg and she was asymptomatic; BPs remained mildly elevated. Patient had an uncomplicated labor course as follows: Membrane Rupture Time/Date: 12:30 AM ,07/08/2020   Delivery Method:Vaginal, Spontaneous  Episiotomy: None  Lacerations:  1st degree;Perineal  Details of delivery can be found in separate delivery note.  Patient had a routine postpartum course. Patient is discharged home 07/10/20.  Newborn Data: Birth date:07/08/2020  Birth time:3:16 AM  Gender:Female  Living status:Living  Apgars:8 ,9  Weight:3856 g (8lb 8oz)  Magnesium Sulfate received: No BMZ received: No Rhophylac:N/A MMR:N/A T-DaP:Given prenatally Flu: No Transfusion:No  Physical exam  Vitals:   07/09/20 0922 07/09/20 1800 07/09/20 2040 07/10/20 0621  BP: 120/70 139/86 127/76 119/68   Pulse:  71 79 83  Resp:  20 18 16   Temp:   98.5 F (36.9 C) 97.9 F (36.6 C)  TempSrc:  Oral Oral Oral  SpO2:  99% 100% 98%  Weight:      Height:       General: alert, cooperative and no distress Lochia: appropriate Uterine Fundus: firm Incision: N/A DVT Evaluation: No evidence of DVT seen on physical exam. Labs: Lab Results  Component Value Date   WBC 9.0 07/07/2020   HGB 10.0 (L) 07/07/2020   HCT 32.7 (L) 07/07/2020   MCV 78.6 (L) 07/07/2020   PLT 402 (H) 07/07/2020   CMP Latest Ref Rng & Units 07/07/2020  Glucose 70 - 99 mg/dL 85  BUN 6 - 20 mg/dL 5(L)  Creatinine 0.44 - 1.00 mg/dL 0.50  Sodium 135 - 145 mmol/L 135  Potassium 3.5 - 5.1 mmol/L 3.9  Chloride 98 - 111 mmol/L 104  CO2 22 - 32 mmol/L 19(L)  Calcium 8.9 - 10.3 mg/dL 9.2  Total Protein 6.5 - 8.1 g/dL 6.8  Total Bilirubin 0.3 - 1.2 mg/dL 0.6  Alkaline Phos 38 - 126 U/L 246(H)  AST 15 - 41 U/L 14(L)  ALT 0 - 44 U/L 8   Edinburgh Score: Edinburgh Postnatal Depression Scale Screening Tool 07/09/2020  I have been able to laugh and see the funny side of things. 0  I have looked forward with enjoyment to things. 0  I have blamed myself unnecessarily when things went wrong. 1  I have been anxious or worried for no good reason. 2  I have felt scared or panicky for no good reason. 1  Things have been getting on top of me. 1  I have been so unhappy that I have had difficulty sleeping. 0  I have felt sad or miserable. 1  I have been so unhappy that I have been crying. 0  The thought of harming myself has occurred to me. 0  Edinburgh Postnatal Depression Scale Total 6   After visit meds:  Allergies as of 07/10/2020   No Known Allergies     Medication List    STOP taking these medications   ascorbic acid 500 MG tablet Commonly known as: VITAMIN C   aspirin EC 81 MG tablet   benzonatate 100 MG capsule Commonly known as: Tessalon Perles   cyclobenzaprine 10 MG tablet Commonly known as: FLEXERIL    ferrous sulfate 325 (65 FE) MG tablet Commonly known as: FerrouSul   fluticasone 50 MCG/ACT nasal spray Commonly known as: FLONASE   pantoprazole 40 MG tablet Commonly known as: Protonix     TAKE these medications   acetaminophen 325 MG tablet Commonly known as: Tylenol Take 2 tablets (650 mg total) by mouth every 4 (four) hours as needed (for pain scale < 4).   amLODipine 5 MG tablet Commonly known as: NORVASC Take 1 tablet (5 mg total) by mouth daily.   ibuprofen 600 MG tablet Commonly known as: ADVIL Take 1 tablet (600 mg total) by mouth every 6 (six) hours.   prenatal multivitamin Tabs tablet Take 1 tablet by mouth daily at 12 noon.      Discharge home in stable condition Infant Feeding: Breast Infant Disposition:home with mother Discharge instruction: per After Visit Summary and Postpartum booklet. Activity: Advance as tolerated. Pelvic rest for 6 weeks.  Diet: routine diet Future Appointments: Future Appointments  Date Time Provider [place]  07/18/2020 10:20 AM [place] None  08/16/2020  1:50 PM Laury Deep, CNM [facility] None   Follow up Visit: Myrtis Ser, CNM  P [facility] Admin Please schedule this patient for Postpartum visit in: 1 week BP check, then 4wk PP visit with the following provider: Any provider  In-Person  For C/S patients schedule nurse incision check in weeks 2 weeks: no  High risk pregnancy complicated by: gHTN  Delivery mode: SVD  Anticipated Birth Control: other/unsure  PP Procedures needed: BP check- 1wk  Schedule Integrated [place] visit: no   07/10/2020 Zoey Gilkeson Autry-Lott, DO

## 2020-07-08 NOTE — Lactation Note (Signed)
This note was copied from a baby's chart. Lactation Consultation Note  Patient Name: Abigail Perez [id number] Date: 07/08/2020 Reason for consult: Initial assessment   Mother is a P39, infant is 41 hours old  Mother was given [facility] brochure and basic teaching done.    Reviewed hand expression with mother. Observed drops of colostrum. .   She is active with WIC . Marland Kitchen  Mother observed with infant latched on the rt breast. Observed infant suckling with audible swallows. Infant sustained latch for 18 mins.    Mother to continue to cue base feed infant and feed at least 8-12 times or more in 24 hours and advised to allow for cluster feeding infant as needed.  Mother to continue to due STS. Mother is aware of available LC services at [facility], BFSG'S, OP Dept, and phone # for questions or concerns about breastfeeding.  Mother receptive to all teaching and plan of care.     Maternal Data Has patient been taught Hand Expression?: Yes Does the patient have breastfeeding experience prior to this delivery?: No  Feeding Feeding Type: Breast Fed  LATCH Score Latch: Grasps breast easily, tongue down, lips flanged, rhythmical sucking.  Audible Swallowing: Spontaneous and intermittent  Type of Nipple: Everted at rest and after stimulation  Comfort (Breast/Nipple): Soft / non-tender  Hold (Positioning): Assistance needed to correctly position infant at breast and maintain latch.  LATCH Score: 9  Interventions Interventions: Breast feeding basics reviewed;Assisted with latch;Skin to skin;Hand express;Breast compression;Adjust position;Support pillows;Position options  Lactation Tools Discussed/Used WIC Program: Yes   Consult Status Consult Status: Follow-up Date: 07/09/20 Follow-up type: In-patient    Stevan Born [facility] 07/08/2020, 10:10 AM

## 2020-07-09 ENCOUNTER — Other Ambulatory Visit (HOSPITAL_COMMUNITY): Payer: Medicaid Other | Attending: Family Medicine

## 2020-07-09 MED ORDER — PANTOPRAZOLE SODIUM 40 MG PO TBEC
40.0000 mg | DELAYED_RELEASE_TABLET | Freq: Every day | ORAL | Status: DC
  Administered 2020-07-09: 40 mg via ORAL
  Filled 2020-07-09: qty 1

## 2020-07-09 MED ORDER — CALCIUM CARBONATE ANTACID 500 MG PO CHEW
1.0000 | CHEWABLE_TABLET | Freq: Two times a day (BID) | ORAL | Status: DC
  Administered 2020-07-10: 200 mg via ORAL
  Filled 2020-07-09: qty 1

## 2020-07-09 MED ORDER — AMLODIPINE BESYLATE 5 MG PO TABS
5.0000 mg | ORAL_TABLET | Freq: Every day | ORAL | Status: DC
  Administered 2020-07-09 – 2020-07-10 (×2): 5 mg via ORAL
  Filled 2020-07-09 (×2): qty 1

## 2020-07-09 NOTE — Lactation Note (Signed)
This note was copied from a baby's chart. Lactation Consultation Note  Patient Name: Boy Zohar Laing [id number] Date: 07/09/2020   Baby boy Brammer now 39 hours old.Infant with three percent weight loss. 4 stools and 1 documented void.  Post circumcision.  Mom reports he has been breastfeeding  well but is really sleepy right now.  Mom reports a friend told her he would be really sleepy after his circimsicion so inquired what is the plan for feeding.   Discussed continuing to feed on cue and 8-12 or more times day based on cues.  Discussed waking to feed if it gets close to 4 hours. Urged to call lactation for assistance to wake and feed if he does not.  Reviewed post circ behavior.  Praised moms efforts.   Urged mom to sleep while baby sleeps. Call lactation as needed.         Aubrei Bouchie S Drew Lips 07/09/2020, 2:52 PM

## 2020-07-09 NOTE — Progress Notes (Signed)
POSTPARTUM PROGRESS NOTE  Subjective: Abigail Perez is a 24 y.o. G1P1001 s/p SVDat [redacted]w[redacted]d.  She reports she doing well. No acute events overnight. She denies any problems with ambulating, voiding or po intake. Denies nausea or vomiting. She has passed flatus. Pain is well controlled.  Lochia is moderate, no passage of clots.  Objective: Blood pressure 135/69, pulse 74, temperature 98.3 F (36.8 C), temperature source Oral, resp. rate 18, height 5\' 7"  (1.702 m), weight 127.7 kg, last menstrual period 09/09/2019, SpO2 100 %, unknown if currently breastfeeding.  Physical Exam:  General: alert, cooperative and no distress Chest: no respiratory distress Abdomen: soft, non-tender  Uterine Fundus: firm and at level of umbilicus Extremities: No calf swelling or tenderness  No edema  Recent Labs    07/07/20 1414  HGB 10.0*  HCT 32.7*   Assessment/Plan: Robena Ewy is a 24 y.o. G1P1001 s/p SVD at [redacted]w[redacted]d for IOL 2/2 gHTN.  Routine Postpartum Care: Doing well, pain well-controlled.  --Elevated BP x2 in the post partum period. Will start on amlodipine 5 mg.  -- Continue routine care, lactation support  -- Contraception: Denies -- Feeding: Breast  Dispo: Plan for discharge home tomorrow.  [redacted]w[redacted]d, MD 07/09/2020 8:54 AM

## 2020-07-10 ENCOUNTER — Other Ambulatory Visit (HOSPITAL_COMMUNITY): Payer: Self-pay | Admitting: Family Medicine

## 2020-07-10 ENCOUNTER — Inpatient Hospital Stay (HOSPITAL_COMMUNITY): Payer: Medicaid Other

## 2020-07-10 ENCOUNTER — Inpatient Hospital Stay (HOSPITAL_COMMUNITY)
Admission: AD | Admit: 2020-07-10 | Payer: Medicaid Other | Source: Home / Self Care | Admitting: Obstetrics and Gynecology

## 2020-07-10 MED ORDER — ACETAMINOPHEN 325 MG PO TABS: 650.0000 mg | ORAL_TABLET | ORAL | Status: AC | PRN

## 2020-07-10 MED ORDER — AMLODIPINE BESYLATE 5 MG PO TABS
5.0000 mg | ORAL_TABLET | Freq: Every day | ORAL | 0 refills | Status: DC
Start: 2020-07-10 — End: 2020-07-10

## 2020-07-10 MED ORDER — IBUPROFEN 600 MG PO TABS
600.0000 mg | ORAL_TABLET | Freq: Four times a day (QID) | ORAL | 0 refills | Status: DC
Start: 2020-07-10 — End: 2020-07-26

## 2020-07-10 MED FILL — AMLODIPINE BESYLATE 5 MG TA: 5 | 30 days supply | Qty: 30 | Fill #0

## 2020-07-10 NOTE — Lactation Note (Signed)
This note was copied from a baby's chart. Lactation Consultation Note  Patient Name: Abigail Perez [id number] Date: 07/10/2020 Reason for consult: Follow-up assessment;Primapara;1st time breastfeeding;Term;Infant weight loss  Baby is 61 hours old , at 41 hours old - 9.2 Bili  As LC entered the room baby latched with depth , and when released the nipple  Slightly slanted.  Baby released and LC showed mom the football position and depth achieved  And swallows. LC had mom release the baby from the breast due to falling asleep and the nipple still slightly slanted but better. Per mom comfortable latch.  Sore nipple and engorgement prevention and tx.  Per mom  has DEBP at home.  LC provided the hand pump and 2 different flange sizes ( #24 F good fit and the #27 F ) , #30 given for when milk comes in if needed.  Mom has the brochure with resource numbers and support groups.    LC brought it to the [facility] attention only 2 wets in 54 hours of life documented and she will check with mom.    Maternal Data    Feeding Feeding Type: Breast Fed  LATCH Score Latch: Grasps breast easily, tongue down, lips flanged, rhythmical sucking.  Audible Swallowing: Spontaneous and intermittent  Type of Nipple: Everted at rest and after stimulation  Comfort (Breast/Nipple): Soft / non-tender  Hold (Positioning): Assistance needed to correctly position infant at breast and maintain latch.  LATCH Score: 9  Interventions Interventions: Breast feeding basics reviewed;Assisted with latch;Skin to skin;Breast massage;Hand express;Breast compression;Adjust position;Support pillows;Position options;Hand pump  Lactation Tools Discussed/Used Pump Review: Milk Storage;Setup, frequency, and cleaning Initiated by:: MAI Date initiated:: 07/10/20   Consult Status Consult Status: Complete Date: 07/10/20 Follow-up type: In-patient    Matilde Sprang Brett Soza 07/10/2020, 9:21 AM

## 2020-07-12 ENCOUNTER — Encounter: Payer: Medicaid Other | Admitting: Obstetrics and Gynecology

## 2020-07-16 ENCOUNTER — Telehealth: Payer: Self-pay | Admitting: General Practice

## 2020-07-16 NOTE — Telephone Encounter (Signed)
Patient was scheduled for a BP check on Wednesday, 07/18/2020.  Pt called to cancel due to baby having appt on same day.  Offered to reschedule, but pt refused.  Asked patient to send Mychart message with BP reading and she said that she would send it.

## 2020-07-18 ENCOUNTER — Ambulatory Visit: Payer: Medicaid Other

## 2020-07-26 ENCOUNTER — Observation Stay (HOSPITAL_COMMUNITY)
Admission: EM | Admit: 2020-07-26 | Discharge: 2020-07-26 | Disposition: A | Discharge status: Home / Self Care | Payer: Medicaid Other | Attending: Family Medicine | Admitting: Family Medicine

## 2020-07-26 ENCOUNTER — Inpatient Hospital Stay (HOSPITAL_COMMUNITY)
Admit: 2020-07-26 | Discharge: 2020-07-26 | Disposition: A | Discharge status: Home / Self Care | Payer: Medicaid Other | Attending: Internal Medicine | Admitting: Internal Medicine

## 2020-07-26 ENCOUNTER — Inpatient Hospital Stay (HOSPITAL_BASED_OUTPATIENT_CLINIC_OR_DEPARTMENT_OTHER): Payer: Medicaid Other

## 2020-07-26 ENCOUNTER — Emergency Department (HOSPITAL_COMMUNITY): Payer: Medicaid Other

## 2020-07-26 ENCOUNTER — Inpatient Hospital Stay (HOSPITAL_COMMUNITY): Payer: Medicaid Other

## 2020-07-26 ENCOUNTER — Encounter (HOSPITAL_COMMUNITY): Payer: Self-pay | Admitting: Emergency Medicine

## 2020-07-26 DIAGNOSIS — U071 COVID-19: Principal | ICD-10-CM

## 2020-07-26 DIAGNOSIS — E162 Hypoglycemia, unspecified: Secondary | ICD-10-CM

## 2020-07-26 DIAGNOSIS — O133 Gestational [pregnancy-induced] hypertension without significant proteinuria, third trimester: Secondary | ICD-10-CM | POA: Diagnosis present

## 2020-07-26 DIAGNOSIS — R55 Syncope and collapse: Secondary | ICD-10-CM

## 2020-07-26 DIAGNOSIS — R4182 Altered mental status, unspecified: Secondary | ICD-10-CM | POA: Diagnosis present

## 2020-07-26 DIAGNOSIS — I639 Cerebral infarction, unspecified: Secondary | ICD-10-CM

## 2020-07-26 DIAGNOSIS — D649 Anemia, unspecified: Secondary | ICD-10-CM

## 2020-07-26 DIAGNOSIS — I6389 Other cerebral infarction: Secondary | ICD-10-CM | POA: Diagnosis not present

## 2020-07-26 DIAGNOSIS — R509 Fever, unspecified: Secondary | ICD-10-CM | POA: Diagnosis present

## 2020-07-26 DIAGNOSIS — A419 Sepsis, unspecified organism: Secondary | ICD-10-CM

## 2020-07-26 LAB — URINALYSIS, ROUTINE W REFLEX MICROSCOPIC
Bacteria, UA: NONE SEEN
Bilirubin Urine: NEGATIVE
Glucose, UA: NEGATIVE mg/dL
Ketones, ur: NEGATIVE mg/dL
Nitrite: NEGATIVE
Protein, ur: 30 mg/dL — AB
Specific Gravity, Urine: 1.027 (ref 1.005–1.030)
pH: 7 (ref 5.0–8.0)

## 2020-07-26 LAB — COMPREHENSIVE METABOLIC PANEL
ALT: 13 U/L (ref 0–44)
ALT: 13 U/L (ref 0–44)
AST: 24 U/L (ref 15–41)
AST: 20 U/L (ref 15–41)
Albumin: 3.7 g/dL (ref 3.5–5.0)
Albumin: 3.5 g/dL (ref 3.5–5.0)
Alkaline Phosphatase: 132 U/L — ABNORMAL HIGH (ref 38–126)
Alkaline Phosphatase: 123 U/L (ref 38–126)
Anion gap: 12 (ref 5–15)
Anion gap: 8 (ref 5–15)
BUN: 10 mg/dL (ref 6–20)
BUN: 9 mg/dL (ref 6–20)
CO2: 21 mmol/L — ABNORMAL LOW (ref 22–32)
CO2: 22 mmol/L (ref 22–32)
Calcium: 8.7 mg/dL — ABNORMAL LOW (ref 8.9–10.3)
Calcium: 8.5 mg/dL — ABNORMAL LOW (ref 8.9–10.3)
Chloride: 104 mmol/L (ref 98–111)
Chloride: 104 mmol/L (ref 98–111)
Creatinine, Ser: 0.68 mg/dL (ref 0.44–1.00)
Creatinine, Ser: 0.6 mg/dL (ref 0.44–1.00)
GFR, Estimated: >60 mL/min (ref >60)
GFR, Estimated: >60 mL/min (ref >60)
Glucose, Bld: 66 mg/dL — ABNORMAL LOW (ref 70–99)
Glucose, Bld: 82 mg/dL (ref 70–99)
Potassium: 3.2 mmol/L — ABNORMAL LOW (ref 3.5–5.1)
Potassium: 3.1 mmol/L — ABNORMAL LOW (ref 3.5–5.1)
Sodium: 137 mmol/L (ref 135–145)
Sodium: 134 mmol/L — ABNORMAL LOW (ref 135–145)
Total Bilirubin: 0.8 mg/dL (ref 0.3–1.2)
Total Bilirubin: 0.8 mg/dL (ref 0.3–1.2)
Total Protein: 7.5 g/dL (ref 6.5–8.1)
Total Protein: 7 g/dL (ref 6.5–8.1)

## 2020-07-26 LAB — APTT
aPTT: 30 seconds (ref 24–36)
aPTT: 32 seconds (ref 24–36)

## 2020-07-26 LAB — ETHANOL: Alcohol, Ethyl (B): <10 mg/dL (ref <10)

## 2020-07-26 LAB — RAPID URINE DRUG SCREEN, HOSP PERFORMED
Amphetamines: NOT DETECTED
Barbiturates: NOT DETECTED
Benzodiazepines: NOT DETECTED
Cocaine: NOT DETECTED
Opiates: NOT DETECTED
Tetrahydrocannabinol: NOT DETECTED

## 2020-07-26 LAB — DIFFERENTIAL
Abs Immature Granulocytes: 0.03 10*3/uL (ref 0.00–0.07)
Basophils Absolute: 0 10*3/uL (ref 0.0–0.1)
Basophils Relative: 0 %
Eosinophils Absolute: 0.1 10*3/uL (ref 0.0–0.5)
Eosinophils Relative: 1 %
Immature Granulocytes: 1 %
Lymphocytes Relative: 9 %
Lymphs Abs: 0.5 10*3/uL — ABNORMAL LOW (ref 0.7–4.0)
Monocytes Absolute: 0.4 10*3/uL (ref 0.1–1.0)
Monocytes Relative: 6 %
Neutro Abs: 4.9 10*3/uL (ref 1.7–7.7)
Neutrophils Relative %: 83 %

## 2020-07-26 LAB — CBC
HCT: 36 % (ref 36.0–46.0)
HCT: 36 % (ref 36.0–46.0)
Hemoglobin: 10.5 g/dL — ABNORMAL LOW (ref 12.0–15.0)
Hemoglobin: 10.8 g/dL — ABNORMAL LOW (ref 12.0–15.0)
MCH: 24 pg — ABNORMAL LOW (ref 26.0–34.0)
MCH: 24.2 pg — ABNORMAL LOW (ref 26.0–34.0)
MCHC: 29.2 g/dL — ABNORMAL LOW (ref 30.0–36.0)
MCHC: 30 g/dL (ref 30.0–36.0)
MCV: 82.4 fL (ref 80.0–100.0)
MCV: 80.7 fL (ref 80.0–100.0)
Platelets: 323 10*3/uL (ref 150–400)
Platelets: 308 10*3/uL (ref 150–400)
RBC: 4.37 MIL/uL (ref 3.87–5.11)
RBC: 4.46 MIL/uL (ref 3.87–5.11)
RDW: 16.2 % — ABNORMAL HIGH (ref 11.5–15.5)
RDW: 16.2 % — ABNORMAL HIGH (ref 11.5–15.5)
WBC: 5.9 10*3/uL (ref 4.0–10.5)
WBC: 7.3 10*3/uL (ref 4.0–10.5)
nRBC: 0 % (ref 0.0–0.2)
nRBC: 0 % (ref 0.0–0.2)

## 2020-07-26 LAB — PROTIME-INR
INR: 1.1 (ref 0.8–1.2)
INR: 1.2 (ref 0.8–1.2)
Prothrombin Time: 13.6 seconds (ref 11.4–15.2)
Prothrombin Time: 14.3 seconds (ref 11.4–15.2)

## 2020-07-26 LAB — ECHOCARDIOGRAM COMPLETE
AR max vel: 1.57 cm2
AV Area VTI: 1.78 cm2
AV Area mean vel: 1.66 cm2
AV Mean grad: 7.8 mmHg
AV Peak grad: 17 mmHg
Ao pk vel: 2.06 m/s
Area-P 1/2: 5.93 cm2
S' Lateral: 3.09 cm

## 2020-07-26 LAB — LACTIC ACID, PLASMA
Lactic Acid, Venous: 4 mmol/L (ref 0.5–1.9)
Lactic Acid, Venous: 2.2 mmol/L (ref 0.5–1.9)
Lactic Acid, Venous: 1 mmol/L (ref 0.5–1.9)

## 2020-07-26 LAB — LIPID PANEL
Cholesterol: 235 mg/dL — ABNORMAL HIGH (ref 0–200)
HDL: 65 mg/dL (ref >40)
LDL Cholesterol: 144 mg/dL — ABNORMAL HIGH (ref 0–99)
Total CHOL/HDL Ratio: 3.6 RATIO
Triglycerides: 128 mg/dL (ref <150)
VLDL: 26 mg/dL (ref 0–40)

## 2020-07-26 LAB — RESP PANEL BY RT-PCR (FLU A&B, COVID) ARPGX2
Influenza A by PCR: NEGATIVE
Influenza B by PCR: NEGATIVE
SARS Coronavirus 2 by RT PCR: POSITIVE — AB

## 2020-07-26 LAB — CBG MONITORING, ED
Glucose-Capillary: 86 mg/dL (ref 70–99)
Glucose-Capillary: 86 mg/dL (ref 70–99)

## 2020-07-26 LAB — PHOSPHORUS: Phosphorus: 2.8 mg/dL (ref 2.5–4.6)

## 2020-07-26 LAB — MAGNESIUM
Magnesium: 2 mg/dL (ref 1.7–2.4)
Magnesium: 1.6 mg/dL — ABNORMAL LOW (ref 1.7–2.4)

## 2020-07-26 LAB — HEMOGLOBIN A1C
Hgb A1c MFr Bld: 5.6 % (ref 4.8–5.6)
Mean Plasma Glucose: 114.02 mg/dL

## 2020-07-26 LAB — PROCALCITONIN: Procalcitonin: <0.1 ng/mL

## 2020-07-26 MED ORDER — ONDANSETRON HCL 4 MG/2ML IJ SOLN: 4.0000 mg | Freq: Four times a day (QID) | INTRAMUSCULAR | Status: DC | PRN

## 2020-07-26 MED ORDER — LACTATED RINGERS IV BOLUS (SEPSIS)
1000.0000 mL | Freq: Once | INTRAVENOUS | Status: AC
  Administered 2020-07-26: 04:00:00 1000 mL via INTRAVENOUS

## 2020-07-26 MED ORDER — DEXTROSE 50 % IV SOLN
INTRAVENOUS | Status: AC
  Filled 2020-07-26: qty 50

## 2020-07-26 MED ORDER — DEXTROSE 50 % IV SOLN
25.0000 mL | Freq: Once | INTRAVENOUS | Status: AC
  Administered 2020-07-26: 25 mL via INTRAVENOUS

## 2020-07-26 MED ORDER — AMLODIPINE BESYLATE 5 MG PO TABS
5.0000 mg | ORAL_TABLET | Freq: Every day | ORAL | Status: DC
  Filled 2020-07-26: qty 1

## 2020-07-26 MED ORDER — VANCOMYCIN HCL IN DEXTROSE 1-5 GM/200ML-% IV SOLN
1000.0000 mg | Freq: Two times a day (BID) | INTRAVENOUS | Status: DC
  Administered 2020-07-26: 12:00:00 1000 mg via INTRAVENOUS
  Filled 2020-07-26: qty 200

## 2020-07-26 MED ORDER — FAMOTIDINE IN NACL 20-0.9 MG/50ML-% IV SOLN: 20.0000 mg | Freq: Once | INTRAVENOUS | Status: DC | PRN

## 2020-07-26 MED ORDER — SODIUM CHLORIDE 0.9 % IV SOLN: INTRAVENOUS | Status: DC | PRN

## 2020-07-26 MED ORDER — MAGNESIUM SULFATE 2 GM/50ML IV SOLN
2.0000 g | Freq: Once | INTRAVENOUS | Status: AC
  Administered 2020-07-26: 06:00:00 2 g via INTRAVENOUS
  Filled 2020-07-26: qty 50

## 2020-07-26 MED ORDER — VANCOMYCIN HCL IN DEXTROSE 1-5 GM/200ML-% IV SOLN
1000.0000 mg | Freq: Once | INTRAVENOUS | Status: DC
  Filled 2020-07-26: qty 200

## 2020-07-26 MED ORDER — EPINEPHRINE 0.3 MG/0.3ML IJ SOAJ: 0.3000 mg | Freq: Once | INTRAMUSCULAR | Status: DC | PRN

## 2020-07-26 MED ORDER — ENOXAPARIN SODIUM 40 MG/0.4ML ~~LOC~~ SOLN: 40.0000 mg | SUBCUTANEOUS | Status: DC

## 2020-07-26 MED ORDER — ONDANSETRON HCL 4 MG PO TABS: 4.0000 mg | ORAL_TABLET | Freq: Four times a day (QID) | ORAL | Status: DC | PRN

## 2020-07-26 MED ORDER — VANCOMYCIN HCL 2000 MG/400ML IV SOLN
2000.0000 mg | Freq: Once | INTRAVENOUS | Status: AC
  Administered 2020-07-26: 02:00:00 2000 mg via INTRAVENOUS
  Filled 2020-07-26: qty 400

## 2020-07-26 MED ORDER — METHYLPREDNISOLONE SODIUM SUCC 125 MG IJ SOLR: 125.0000 mg | Freq: Once | INTRAMUSCULAR | Status: DC | PRN

## 2020-07-26 MED ORDER — ALBUTEROL SULFATE HFA 108 (90 BASE) MCG/ACT IN AERS: 2.0000 | INHALATION_SPRAY | Freq: Once | RESPIRATORY_TRACT | Status: DC | PRN

## 2020-07-26 MED ORDER — LACTATED RINGERS IV BOLUS (SEPSIS)
1000.0000 mL | Freq: Once | INTRAVENOUS | Status: AC
  Administered 2020-07-26: 05:00:00 1000 mL via INTRAVENOUS

## 2020-07-26 MED ORDER — SODIUM CHLORIDE 0.9 % IV SOLN
2.0000 g | Freq: Three times a day (TID) | INTRAVENOUS | Status: DC
  Administered 2020-07-26: 11:00:00 2 g via INTRAVENOUS
  Filled 2020-07-26: qty 2

## 2020-07-26 MED ORDER — DIPHENHYDRAMINE HCL 50 MG/ML IJ SOLN: 50.0000 mg | Freq: Once | INTRAMUSCULAR | Status: DC | PRN

## 2020-07-26 MED ORDER — SODIUM CHLORIDE 0.9 % IV SOLN
2.0000 g | Freq: Once | INTRAVENOUS | Status: AC
  Administered 2020-07-26: 02:00:00 2 g via INTRAVENOUS
  Filled 2020-07-26: qty 2

## 2020-07-26 MED ORDER — ACETAMINOPHEN 325 MG PO TABS
650.0000 mg | ORAL_TABLET | Freq: Four times a day (QID) | ORAL | Status: DC | PRN
  Administered 2020-07-26: 650 mg via ORAL
  Filled 2020-07-26: qty 2

## 2020-07-26 MED ORDER — SODIUM CHLORIDE 0.9 % IV SOLN
1200.0000 mg | Freq: Once | INTRAVENOUS | Status: AC
  Administered 2020-07-26: 19:00:00 1200 mg via INTRAVENOUS
  Filled 2020-07-26: qty 10

## 2020-07-26 MED ORDER — METRONIDAZOLE 500 MG PO TABS
500.0000 mg | ORAL_TABLET | Freq: Three times a day (TID) | ORAL | Status: DC
  Administered 2020-07-26 (×2): 500 mg via ORAL
  Filled 2020-07-26 (×2): qty 1

## 2020-07-26 MED ORDER — LACTATED RINGERS IV BOLUS (SEPSIS)
1000.0000 mL | Freq: Once | INTRAVENOUS | Status: AC
  Administered 2020-07-26: 06:00:00 1000 mL via INTRAVENOUS

## 2020-07-26 MED ORDER — ACETAMINOPHEN 650 MG RE SUPP: 650.0000 mg | Freq: Four times a day (QID) | RECTAL | Status: DC | PRN

## 2020-07-26 MED ORDER — LACTATED RINGERS IV SOLN: INTRAVENOUS | Status: DC

## 2020-07-26 MED ORDER — ACETAMINOPHEN 325 MG PO TABS
650.0000 mg | ORAL_TABLET | Freq: Once | ORAL | Status: AC
  Administered 2020-07-26: 02:00:00 650 mg via ORAL
  Filled 2020-07-26: qty 2

## 2020-07-26 MED ORDER — CEPHALEXIN 500 MG PO CAPS: 500.0000 mg | ORAL_CAPSULE | Freq: Three times a day (TID) | ORAL | 0 refills | Status: AC

## 2020-07-26 NOTE — Consult Note (Signed)
[facility] Tele[facility]urology Consult Services   Date of Service:   07/26/2020 01:30:28  Diagnosis:     .  R55 - Syncope (blackout, fainting, vasovagal attack)  Impression: This is a 24 year old woman with a syncopal episode as well as transient left-sided weakness. I recommend an MRI of the brain. I recommend a carotid Doppler and echocardiogram. I will have cardiology see the patient. This would be a peculiar presentation of stroke. We did briefly discuss TPA. Given that her symptoms are improving and she only has some mild left-sided numbness which is most prominent in the arm I would not proceed with that treatment.  Recommend EEG as well  Metrics: Last Known Well: 07/26/2020 00:15:00 [facility] Notification Time: 07/26/2020 01:30:28 Arrival Time: 07/26/2020 01:23:00 Stamp Time: 07/26/2020 01:30:28 Initial Response Time: 07/26/2020 01:34:57 Time First Login Attempt: 07/26/2020 01:34:57 Symptoms: left sided weakness. NIHSS Start Assessment Time: 07/26/2020 01:40:47 Thrombolytic Early Mix Decision Time: 07/26/2020 01:41:23 Patient is not a candidate for Thrombolytic. Thrombolytic Medical Decision: 07/26/2020 01:41:24 Patient was not deemed candidate for Thrombolytic because of following reasons: No disabling symptoms.  CT head showed no acute hemorrhage or acute core infarct.  ED Physician notified of diagnostic impression and management plan on 07/26/2020 01:48:50  Advanced Imaging: Advanced Imaging Not Recommended because:  Clinical Presentation is not Suggestive of LVO and NIHSS is <6   Our recommendations are outlined below.  Recommendations:     .  Activate Stroke Protocol Admission/Order Set     .  Stroke/Telemetry Floor     .  Neuro Checks     .  Bedside Swallow Eval     .  DVT Prophylaxis     .  IV Fluids, Normal Saline     .  Head of Bed 30 Degrees     .  Euglycemia and Avoid Hyperthermia (PRN Acetaminophen)  Routine Consultation with  Inhouse Neurology for Follow up Care  Sign Out:     .  Discussed with Emergency Department Provider    ------------------------------------------------------------------------------  History of Present Illness: Patient is a 24 year old Female.  Patient was brought by EMS for symptoms of left sided weakness.  This is a 24 year old right-handed African-American woman who is 2 weeks postpartum. The patient was breast-feeding her baby today. At approximately 00:15 the patient lost consciousness. She does not remember exactly what happened to her. She was noted to become lethargic and diffusely weak. She lost awareness. EMS was called. They felt that she was weaker on the left side of her body. Her blood sugar was 62 at the time. The patient did receive glucose and it is now 85. She has improved since receiving that.  Last seen normal was within 4.5 hours. There is no history of hemorrhagic complications or intracranial hemorrhage. There is no history of Recent Anticoagulants. There is no history of recent major surgery. There is no history of recent stroke.  Past Medical History:     . There is NO history of Hypertension     . There is NO history of Diabetes Mellitus     . There is NO history of Hyperlipidemia     . There is NO history of Atrial Fibrillation     . There is NO history of Coronary Artery Disease     . There is NO history of Stroke     . There is NO history of Covid-19  Social History: Smoking: No Alcohol Use: No Drug Use: No  Anticoagulant use:  No  Antiplatelet use: No  Allergies:  Reviewed     Examination: BP(129/87), Pulse(82), Blood Glucose(85) 1A: Level of Consciousness - Alert; keenly responsive + 0 1B: Ask Month and Age - Both Questions Right + 0 1C: Blink Eyes & Squeeze Hands - Performs Both Tasks + 0 2: Test Horizontal Extraocular Movements - Normal + 0 3: Test Visual Fields - No Visual Loss + 0 4: Test Facial Palsy (Use Grimace if Obtunded) -  Normal symmetry + 0 5A: Test Left Arm Motor Drift - No Drift for 10 Seconds + 0 5B: Test Right Arm Motor Drift - No Drift for 10 Seconds + 0 6A: Test Left Leg Motor Drift - No Drift for 5 Seconds + 0 6B: Test Right Leg Motor Drift - No Drift for 5 Seconds + 0 7: Test Limb Ataxia (FNF/Heel-Shin) - No Ataxia + 0 8: Test Sensation - Mild-Moderate Loss: Less Sharp/More Dull + 1 9: Test Language/Aphasia - Normal; No aphasia + 0 10: Test Dysarthria - Normal + 0 11: Test Extinction/Inattention - No abnormality + 0  NIHSS Score: 1  Pre-Morbid Modified Rankin Scale: 0 Points = No symptoms at all   Patient/Family was informed the Neurology Consult would occur via TeleHealth consult by way of interactive audio and video telecommunications and consented to receiving care in this manner.   Patient is being evaluated for possible acute neurologic impairment and high probability of imminent or life-threatening deterioration. I spent total of 30 minutes providing care to this patient, including time for face to face visit via telemedicine, review of medical records, imaging studies and discussion of findings with providers, the patient and/or family.   Dr Raynelle Dick   [facility] [phone]  Case [id number]

## 2020-07-26 NOTE — TOC Initial Note (Addendum)
Transition of Care [facility]) - Initial/Assessment Note    Patient Details  Name: Abigail Perez MRN: [id number] Date of Birth: 1995/10/31  [facility]) CM/SW Contact:    Annice Needy, LCSW Phone Number: 07/26/2020, 11:15 AM  Clinical Narrative:                 Patient from home with Godmother, Abigail Perez. Admitted COVID+. 17 days postpartum, breastfeeding. Spoke with patient regarding care plan for newborn while she is in quarantine. She advised that Ms. Abigail Perez would provide care for the baby while she in in quarantine. Patient agreed to pump milk rather than breast feed during quarantine.  Spoke with Ms. Abigail Perez who agreed that she would provide care to the baby while patient was in quarantine. Discussed that patient would need to pump milk rather than breast feed while she was in quarantine. Ms. Abigail Perez advises that she will bring her breast pump to the hospital and storage bags so that patient can begin pumping. Advised that milk would have to be picked up as we have no where to store pumped milk. Ms. Abigail Perez advised that patient will quarantine in finished basement in her home.   Expected Discharge Plan: Home/Self Care Barriers to Discharge: Continued Medical Work up   Patient Goals and CMS Choice Patient states their goals for this hospitalization and ongoing recovery are:: return home      Expected Discharge Plan and Services Expected Discharge Plan: Home/Self Care       Living arrangements for the past 2 months: Single Family Home                                      Prior Living Arrangements/Services Living arrangements for the past 2 months: Single Family Home Lives with:: Other (Comment) (Godmother, Chief of Staff) Patient language and need for interpreter reviewed:: Yes Do you feel safe going back to the place where you live?: Yes      Need for Family Participation in Patient Care: Yes (Comment) Care giver support system in place?: Yes  (comment)   Criminal Activity/Legal Involvement Pertinent to Current Situation/Hospitalization: No - Comment as needed  Activities of Daily Living      Permission Sought/Granted Permission sought to share information with : Family Supports    Share Information with NAME: Abigail Perez     Permission granted to share info w Relationship: godmother     Emotional Assessment Appearance:: Appears younger than stated age   Affect (typically observed): Appropriate Orientation: : Oriented to Self,Oriented to Place,Oriented to  Time,Oriented to Situation Alcohol / Substance Use: Not Applicable Psych Involvement: No (comment)  Admission diagnosis:  Acute febrile illness [R50.9] Patient Active Problem List   Diagnosis Date Noted  . Acute febrile illness 07/26/2020  . Syncope 07/26/2020  . COVID-19 virus infection 07/26/2020  . Group beta Strep positive 07/08/2020  . Gestational hypertension, third trimester 07/07/2020  . Obesity affecting pregnancy in third trimester 01/06/2020  . Post term pregnancy over 40 weeks 12/20/2019   PCP:  Patient, No Pcp Per Pharmacy:   [id number] [place], [place] - [address] AT [facility] [address] [place] [address] Phone: [phone] Fax: [phone]  [facility] [place], [place] - [address] [place] [address] Phone: [phone] Fax: [phone]  Social Determinants of Health (SDOH) Interventions    Readmission Risk Interventions No flowsheet data found.

## 2020-07-26 NOTE — ED Notes (Signed)
Neurologist does not think patient is a TPA candidate, will do NIH every 2 hours.

## 2020-07-26 NOTE — ED Triage Notes (Signed)
Pt was last normal at 0015. Pt was sitting on couch and went out per family.

## 2020-07-26 NOTE — Discharge Instructions (Signed)
1) You are strongly advised to  to isolate/quarantine for at least 21 days from the date of your diagnoses with COVID-19 infection--please always wear a mask if you have to go outside the house  2) please avoid close contact with you to your newborn son for at least 21 days (until [place] Year at the earliest)--- due to significant risk of Covid 19 infection--- you may pump your breast milk and have somebody else help you feed your newborn from a feeding bottle with your breastmilk  3) video/virtual visit with your primary care physician within a week advised  4) please take Keflex/cephalexin antibiotic as prescribed for presumed UTI/urinary tract infection

## 2020-07-26 NOTE — ED Notes (Signed)
Date and time results received: 07/26/20 4:37 AM    Test: COVID Critical Value: positive   Name of Provider Notified: Dr. Preston Fleeting   Orders Received? Or Actions Taken?: none yet

## 2020-07-26 NOTE — Care Management Obs Status (Signed)
MEDICARE OBSERVATION STATUS NOTIFICATION   Patient Details  Name: Abigail Perez MRN: [id number] Date of Birth: [birth year]   Medicare Observation Status Notification Given:  Yes    Villa Herb, LCSWA 07/26/2020, 6:08 PM

## 2020-07-26 NOTE — H&P (Signed)
History and Physical  Abigail Perez UMP:[id number] DOB: 1996-06-03 DOA: 07/26/2020  Referring physician: Delora Fuel, MD PCP: Patient, No Pcp Per  Patient coming from: Home  Chief Complaint: Code stroke  HPI: Abigail Perez is a 24 y.o. female with medical history significant for gestational hypertension and GERD who presents to the emergency department via EMS due to suspicion for stroke.  Patient remembered that she was having chills when she suddenly felt some left-sided weakness.  It was reported that patient states that she was holding her baby when she collapsed.  She is 17 days postpartum.  Patient states that she was in her state of rest prior to onset of symptoms.  She was unsure what led to the symptoms.  She complains of having bloody discharge when she wipes (?Lochia rubra), this has been ongoing since birth delivery.  Patient denies chest pain, shortness of breath, headache, nausea, vomiting, abdominal pain  ED Course:  In the emergency department, she presented with a temperature of 103.22F and intermittently tachypneic.  Work-up in the ED showed normocytic anemia, hypokalemia, hypoglycemia, lactic acid 4.0, ALP 132.  SARS coronavirus 2 was positive. Chest X ray showed no active disease.  CT of head without contrast showed normal head CT. Patient was empirically started on IV cefepime, Flagyl and vancomycin.  IV hydration with 2 L of LR was provided.  Teleneurologist was consulted and recommended further stroke work-up hospitalist was asked to admit patient for further evaluation and management.  Review of Systems: Constitutional: Positive for chills.  Negative for fever.  HENT: Negative for ear pain and sore throat.   Eyes: Negative for pain and visual disturbance.  Respiratory: Negative for cough, chest tightness and shortness of breath.   Cardiovascular: Negative for chest pain and palpitations.  Gastrointestinal: Negative for abdominal pain and vomiting.  Endocrine:  Negative for polyphagia and polyuria.  Genitourinary: Negative for decreased urine volume, dysuria, enuresis Musculoskeletal: Negative for arthralgias and back pain.  Skin: Negative for color change and rash.  Allergic/Immunologic: Negative for immunocompromised state.  Neurological: Positive for left-sided weakness (resolved).  Negative for tremors, speech difficulty, weakness, light-headedness and headaches.  Hematological: Does not bruise/bleed easily.  All other systems reviewed and are negative  Past Medical History:  Diagnosis Date  . GERD (gastroesophageal reflux disease)    Past Surgical History:  Procedure Laterality Date  . NO PAST SURGERIES      Social History:  reports that she has never smoked. She has never used smokeless tobacco. She reports that she does not drink alcohol and does not use drugs.   No Known Allergies  Family History  Problem Relation Age of Onset  . Diabetes Maternal Grandmother   . Hypertension Maternal Grandmother   . Hypertension Mother      Prior to Admission medications   Medication Sig Start Date End Date Taking? Authorizing Provider  acetaminophen (TYLENOL) 325 MG tablet Take 2 tablets (650 mg total) by mouth every 4 (four) hours as needed (for pain scale < 4). 07/10/20   Autry-Lott, Naaman Plummer, DO  amLODipine (NORVASC) 5 MG tablet Take 1 tablet (5 mg total) by mouth daily. 07/10/20   Autry-Lott, Naaman Plummer, DO  ibuprofen (ADVIL) 600 MG tablet Take 1 tablet (600 mg total) by mouth every 6 (six) hours. 07/10/20   Autry-Lott, Naaman Plummer, DO  Prenatal Vit-Fe Fumarate-FA (PRENATAL MULTIVITAMIN) TABS tablet Take 1 tablet by mouth daily at 12 noon.     [provider]  Levonorgestrel-Ethinyl Estradiol (ASHLYNA) 0.15-0.03 &0.01 MG tablet Take  1 tablet by mouth daily.  11/06/19  [provider]  omeprazole (PRILOSEC) 20 MG capsule Take 1 capsule (20 mg total) by mouth daily. Patient not taking: Reported on 11/06/2019 05/29/15 11/06/19  Palumbo,  April, MD    Physical Exam: BP 126/72   Pulse 89   Temp (!) 103 F (39.4 C) (Oral)   Resp (!) 28   SpO2 100%   . General: 24 y.o. year-old female well developed well nourished in no acute distress.  Alert and oriented x3. . HEENT: NCAT, EOMI . Neck: Supple, trachea medial . Cardiovascular: Regular rate and rhythm with no rubs or gallops.  No thyromegaly or JVD noted.  No lower extremity edema. 2/4 pulses in all 4 extremities. . Respiratory: Clear to auscultation with no wheezes or rales. Good inspiratory effort. . Abdomen: Soft nontender nondistended with normal bowel sounds x4 quadrants. . Muskuloskeletal: No cyanosis, clubbing or edema noted bilaterally . Neuro: CN II-XII intact, strength, sensation, reflexes . Skin: No ulcerative lesions noted or rashes . Psychiatry: Judgement and insight appear normal. Mood is appropriate for condition and setting          Labs on Admission:  Basic Metabolic Panel: Recent Labs  Lab 07/26/20 0231 07/26/20 0435  NA 137 134*  K 3.2* 3.1*  CL 104 104  CO2 21* 22  GLUCOSE 66* 82  BUN 10 9  CREATININE 0.68 0.60  CALCIUM 8.7* 8.5*  MG 2.0 1.6*  PHOS  --  2.8   Liver Function Tests: Recent Labs  Lab 07/26/20 0231 07/26/20 0435  AST 24 20  ALT 13 13  ALKPHOS 132* 123  BILITOT 0.8 0.8  PROT 7.5 7.0  ALBUMIN 3.7 3.5   No results for input(s): LIPASE, AMYLASE in the last 168 hours. No results for input(s): AMMONIA in the last 168 hours. CBC: Recent Labs  Lab 07/26/20 0231 07/26/20 0435  WBC 5.9 7.3  NEUTROABS 4.9  --   HGB 10.5* 10.8*  HCT 36.0 36.0  MCV 82.4 80.7  PLT 323 308   Cardiac Enzymes: No results for input(s): CKTOTAL, CKMB, CKMBINDEX, TROPONINI in the last 168 hours.  BNP (last 3 results) No results for input(s): BNP in the last 8760 hours.  ProBNP (last 3 results) No results for input(s): PROBNP in the last 8760 hours.  CBG: Recent Labs  Lab 07/26/20 0143  GLUCAP 86    Radiological Exams on  Admission: DG Chest Port 1 View  Result Date: 07/26/2020 CLINICAL DATA:  Questionable sepsis EXAM: PORTABLE CHEST 1 VIEW COMPARISON:  None. FINDINGS: Low lung volumes. Heart and mediastinal contours are within normal limits. No focal opacities or effusions. No acute bony abnormality. IMPRESSION: No active disease. Electronically Signed   By: Kevin  Dover M.D.   On: 07/26/2020 02:41   CT HEAD CODE STROKE WO CONTRAST  Result Date: 07/26/2020 CLINICAL DATA:  Code stroke.  Acute loss of consciousness EXAM: CT HEAD WITHOUT CONTRAST TECHNIQUE: Contiguous axial images were obtained from the base of the skull through the vertex without intravenous contrast. COMPARISON:  None. FINDINGS: Brain: There is no mass, hemorrhage or extra-axial collection. The size and configuration of the ventricles and extra-axial CSF spaces are normal. The brain parenchyma is normal, without evidence of acute or chronic infarction. Vascular: No abnormal hyperdensity of the major intracranial arteries or dural venous sinuses. No intracranial atherosclerosis. Skull: The visualized skull base, calvarium and extracranial soft tissues are normal. Sinuses/Orbits: No fluid levels or advanced mucosal thickening of the visualized paranasal   sinuses. No mastoid or middle ear effusion. The orbits are normal. ASPECTS (Alberta Stroke Program Early CT Score) - Ganglionic level infarction (caudate, lentiform nuclei, internal capsule, insula, M1-M3 cortex): 7 - Supraganglionic infarction (M4-M6 cortex): 3 Total score (0-10 with 10 being normal): 10 IMPRESSION: 1. Normal head CT. 2. ASPECTS is 10. These results were called by telephone at the time of interpretation on 07/26/2020 at 1:44 am to provider DAVID GLICK , who verbally acknowledged these results. Electronically Signed   By: Kevin  Herman M.D.   On: 07/26/2020 01:44    EKG: I independently viewed the EKG done and my findings are as followed: EKG was not done in the  ED  Assessment/Plan Present on Admission: . Acute febrile illness . Gestational hypertension, third trimester  Active Problems:   Gestational hypertension, third trimester   Acute febrile illness   Syncope  Sepsis secondary to acute febrile illness possibly secondary to COVID-19 virus infection Patient was febrile and tachypneic (met SIRS criteria), SARS coronavirus 2 was positive Lactic acid was 4.0. IV hydration was provided and patient was empirically started on IV antibiotics (cefepime, vancomycin and Flagyl) we shall continue with same at this time with plan to de-escalate/discontinue based on procalcitonin O2 sats was 99-100% on room air Patient is currently not requiring any oxygen and there is no indication to start remdesivir/prednisone at this time Continue airborne precaution  Lactic acidosis Lactic acid 4.0 > 2.2 Continue to trend lactic acid  Hypomagnesemia Magnesium 1.6; this was replenished  Hypoglycemia-resolved Continue Accu-Cheks  Transitory left-sided weakness possibly due to syncope R/O stroke vs seizures Patient will be admitted to telemetry unit  Bilateral carotid ultrasound in the morning Echocardiogram in the morning MRI of brain without contrast in the morning Continue fall precautions and neuro checks EEG will be done in the morning Lipid panel and hemoglobin A1c will be checked Continue PT/OT eval and treat Bedside swallow eval by nursing prior to diet Neurology will be consulted and we shall await further recommendation. Consider cardiology consult based on echo findings  Gestational hypertension  Continue amlodipine  DVT prophylaxis: SCDs (with plan to start patient on Lovenox if MRI of brain is negative)  Code Status: Full code  Family Communication: None at bedside  Disposition Plan:  Patient is from:                        home Anticipated DC to:                   home Anticipated DC date:               2-3 days Anticipated DC  barriers:          Patient is unstable to be discharged at this time due to acute febrile illness secondary to COVID-19 virus and need for stroke work-up   Consults called: Neurology  Admission status: Inpatient   Oladapo Adefeso MD [facility]  07/26/2020, 5:36 AM   

## 2020-07-26 NOTE — Evaluation (Signed)
Physical Therapy Evaluation Patient Details Name: Abigail Perez MRN: [id number] DOB: 04-24-1996 Today's Date: 07/26/2020   History of Present Illness  Deb Loudin is a 24 y.o. female with medical history significant for gestational hypertension and GERD who presents to the emergency department via EMS due to suspicion for stroke.  Patient remembered that she was having chills when she suddenly felt some left-sided weakness.  It was reported that patient states that she was holding her baby when she collapsed.  She is 17 days postpartum.  Patient states that she was in her state of rest prior to onset of symptoms.  She was unsure what led to the symptoms.  She complains of having bloody discharge when she wipes (?Lochia rubra), this has been ongoing since birth delivery.  Patient denies chest pain, shortness of breath, headache, nausea, vomiting, abdominal pain    Clinical Impression  Patient functioning near baseline for functional mobility and gait, demonstrates good return for getting into/out of bed and ambulating in room without loss of balance.  Plan:  Patient discharged from physical therapy to care of nursing for ambulation daily as tolerated for length of stay.     Follow Up Recommendations No PT follow up;Supervision - Intermittent    Equipment Recommendations  None recommended by PT    Recommendations for Other Services       Precautions / Restrictions Precautions Precautions: None Restrictions Weight Bearing Restrictions: No      Mobility  Bed Mobility Overal bed mobility: Modified Independent                  Transfers Overall transfer level: Modified independent                  Ambulation/Gait Ambulation/Gait assistance: Modified independent (Device/Increase time) Gait Distance (Feet): 30 Feet Assistive device: None Gait Pattern/deviations: WFL(Within Functional Limits) Gait velocity: slightly decreased   General Gait Details: demonstrates  good return for ambulation in room without loss of balance  Stairs            Wheelchair Mobility    Modified Rankin (Stroke Patients Only)       Balance Overall balance assessment: No apparent balance deficits (not formally assessed)                                           Pertinent Vitals/Pain Pain Assessment: No/denies pain    Home Living Family/patient expects to be discharged to:: Private residence Living Arrangements: Other relatives Available Help at Discharge: Family;Available PRN/intermittently Type of Home: House Home Access: Level entry     Home Layout: Two level Home Equipment: None      Prior Function Level of Independence: Independent         Comments: community ambulator, drives     Higher education careers adviser        Extremity/Trunk Assessment   Upper Extremity Assessment Upper Extremity Assessment: Defer to OT evaluation    Lower Extremity Assessment Lower Extremity Assessment: Overall WFL for tasks assessed    Cervical / Trunk Assessment Cervical / Trunk Assessment: Normal  Communication   Communication: No difficulties  Cognition Arousal/Alertness: Awake/alert Behavior During Therapy: WFL for tasks assessed/performed Overall Cognitive Status: Within Functional Limits for tasks assessed  General Comments      Exercises     Assessment/Plan    PT Assessment Patent does not need any further PT services  PT Problem List         PT Treatment Interventions      PT Goals (Current goals can be found in the Care Plan section)  Acute Rehab PT Goals Patient Stated Goal: return home PT Goal Formulation: With patient Time For Goal Achievement: 07/26/20 Potential to Achieve Goals: Good    Frequency     Barriers to discharge        Co-evaluation               AM-PAC PT "6 Clicks" Mobility  Outcome Measure Help needed turning from your back to your  side while in a flat bed without using bedrails?: None Help needed moving from lying on your back to sitting on the side of a flat bed without using bedrails?: None Help needed moving to and from a bed to a chair (including a wheelchair)?: None Help needed standing up from a chair using your arms (e.g., wheelchair or bedside chair)?: None Help needed to walk in hospital room?: None Help needed climbing 3-5 steps with a railing? : None 6 Click Score: 24    End of Session   Activity Tolerance: Patient tolerated treatment well Patient left: in bed;with call bell/phone within reach Nurse Communication: Mobility status PT Visit Diagnosis: Unsteadiness on feet (R26.81);Other abnormalities of gait and mobility (R26.89);Muscle weakness (generalized) (M62.81)    Time: 4315-4008 PT Time Calculation (min) (ACUTE ONLY): 20 min   Charges:   PT Evaluation $PT Eval Moderate Complexity: 1 Mod PT Treatments $Therapeutic Activity: 23-37 mins        1:20 PM, 07/26/20 Ocie Bob, MPT Physical Therapist with [facility] 336 [phone] office [phone] mobile phone

## 2020-07-26 NOTE — Progress Notes (Signed)
Following for Code Sepsis  

## 2020-07-26 NOTE — ED Notes (Addendum)
Date and time results received: 07/26/20 0332   Test: Lactic  Critical Value: 4.0  Name of Provider Notified: Preston Fleeting  Orders Received? Or Actions Taken?: NA

## 2020-07-26 NOTE — Progress Notes (Signed)
Pharmacy Antibiotic Note  Abigail Perez is a 24 y.o. female admitted on 07/26/2020 with syncope.  Pharmacy has been consulted for Vancomycin/Cefepime dosing for r/o sepsis in the setting of fever up to 103.1. WBC WNL. Lactic acid is elevated. Note pt is breastfeeding.   Plan: Vancomycin 2000 mg IV x 1, then 1000 mg IV q12h Cefepime 2g IV q8h Trend WBC, temp, renal function  F/U infectious work-up Drug levels as indicated  Temp (24hrs), Avg:103.1 F (39.5 C), Min:103.1 F (39.5 C), Max:103.1 F (39.5 C)  Recent Labs  Lab 07/26/20 0231  WBC 5.9  CREATININE 0.68  LATICACIDVEN 4.0*    CrCl cannot be calculated (Unknown ideal weight.).    No Known Allergies  Abran Duke, PharmD, BCPS Clinical Pharmacist Phone: [phone]

## 2020-07-26 NOTE — ED Notes (Signed)
Pt was educated on the getting Vanc 1gm while breastfeeding. Pt decided that she wanted to go ahead and get the medication.

## 2020-07-26 NOTE — Care Management CC44 (Signed)
Condition Code 44 Documentation Completed  Patient Details  Name: Abigail Perez MRN: [id number] Date of Birth: [birth year]   Condition Code 44 given:  Yes Patient signature on Condition Code 44 notice:  Yes Documentation of 2 MD's agreement:  Yes Code 44 added to claim:  Yes    Villa Herb, LCSWA 07/26/2020, 6:08 PM

## 2020-07-26 NOTE — ED Provider Notes (Addendum)
[facility] EMERGENCY DEPARTMENT Provider Note   CSN: [id number] Arrival date & time: 07/26/20  0117   History Chief Complaint  Patient presents with  . Code Stroke    Abigail Perez is a 24 y.o. female.  The history is provided by the patient and the EMS personnel. The history is limited by the condition of the patient (Altered mental status).  She has history of GERD and is 17 days postpartum and was brought in by ambulance as a possible stroke.  She was reported to have been holding her baby when she collapsed.  EMS noted some left-sided weakness.  Patient is able to answer some simple questions and notes that she is in a hospital.  Past Medical History:  Diagnosis Date  . GERD (gastroesophageal reflux disease)     Patient Active Problem List   Diagnosis Date Noted  . Group beta Strep positive 07/08/2020  . Gestational hypertension, third trimester 07/07/2020  . Obesity affecting pregnancy in third trimester 01/06/2020  . Post term pregnancy over 40 weeks 12/20/2019    Past Surgical History:  Procedure Laterality Date  . NO PAST SURGERIES       OB History    Gravida  1   Para  1   Term  1   Preterm  0   AB  0   Living  1     SAB  0   IAB  0   Ectopic  0   Multiple  0   Live Births  1           Family History  Problem Relation Age of Onset  . Diabetes Maternal Grandmother   . Hypertension Maternal Grandmother   . Hypertension Mother     Social History   Tobacco Use  . Smoking status: Never Smoker  . Smokeless tobacco: Never Used  Vaping Use  . Vaping Use: Never used  Substance Use Topics  . Alcohol use: No  . Drug use: No    Home Medications Prior to Admission medications   Medication Sig Start Date End Date Taking? Authorizing Provider  acetaminophen (TYLENOL) 325 MG tablet Take 2 tablets (650 mg total) by mouth every 4 (four) hours as needed (for pain scale < 4). 07/10/20   Autry-Lott, Randa Evens, DO  amLODipine (NORVASC) 5 MG  tablet Take 1 tablet (5 mg total) by mouth daily. 07/10/20   Autry-Lott, Randa Evens, DO  ibuprofen (ADVIL) 600 MG tablet Take 1 tablet (600 mg total) by mouth every 6 (six) hours. 07/10/20   Autry-Lott, Randa Evens, DO  Prenatal Vit-Fe Fumarate-FA (PRENATAL MULTIVITAMIN) TABS tablet Take 1 tablet by mouth daily at 12 noon.     [provider]  Levonorgestrel-Ethinyl Estradiol (ASHLYNA) 0.15-0.03 &0.01 MG tablet Take 1 tablet by mouth daily.  11/06/19  [provider]  omeprazole (PRILOSEC) 20 MG capsule Take 1 capsule (20 mg total) by mouth daily. Patient not taking: Reported on 11/06/2019 05/29/15 11/06/19  Nicanor Alcon, April, MD    Allergies    Patient has no known allergies.  Review of Systems   Review of Systems  Unable to perform ROS: Mental status change    Physical Exam Updated Vital Signs BP 126/72   Pulse 89   Temp (!) 103.1 F (39.5 C) (Oral)   Resp (!) 28   SpO2 100%   Physical Exam Vitals and nursing note reviewed.   24 year old female, resting comfortably and in no acute distress. Vital signs are significant for elevated temperature and  respiratory rate. Oxygen saturation is 100%, which is normal. Head is normocephalic and atraumatic.  Pupils are 5 mm and reactive, EOMI. Oropharynx is clear. Neck is nontender and supple without adenopathy or JVD. Back is nontender and there is no CVA tenderness. Lungs are clear without rales, wheezes, or rhonchi. Chest is nontender. Heart has regular rate and rhythm without murmur. Abdomen is soft, flat, nontender without masses or hepatosplenomegaly and peristalsis is normoactive. Extremities have no cyanosis or edema, full range of motion is present. Skin is warm and dry without rash. Neurologic: Somnolent but arousable, oriented to person and place.  No obvious facial droop, cranial nerves are grossly intact.  She will wiggle toes bilaterally when asked, is able to lift her right leg off the bed but not the left.  Not able to  raise her arms.   ED Results / Procedures / Treatments   Labs (all labs ordered are listed, but only abnormal results are displayed) Labs Reviewed  CBC - Abnormal; Notable for the following components:      Result Value   Hemoglobin 10.5 (*)    MCH 24.0 (*)    MCHC 29.2 (*)    RDW 16.2 (*)    All other components within normal limits  DIFFERENTIAL - Abnormal; Notable for the following components:   Lymphs Abs 0.5 (*)    All other components within normal limits  COMPREHENSIVE METABOLIC PANEL - Abnormal; Notable for the following components:   Potassium 3.2 (*)    CO2 21 (*)    Glucose, Bld 66 (*)    Calcium 8.7 (*)    Alkaline Phosphatase 132 (*)    All other components within normal limits  LACTIC ACID, PLASMA - Abnormal; Notable for the following components:   Lactic Acid, Venous 4.0 (*)    All other components within normal limits  CULTURE, BLOOD (ROUTINE X 2)  CULTURE, BLOOD (ROUTINE X 2)  URINE CULTURE  RESP PANEL BY RT-PCR (FLU A&B, COVID) ARPGX2  ETHANOL  PROTIME-INR  APTT  MAGNESIUM  RAPID URINE DRUG SCREEN, HOSP PERFORMED  URINALYSIS, ROUTINE W REFLEX MICROSCOPIC  LACTIC ACID, PLASMA  CBG MONITORING, ED    EKG Normal sinus rhythm 76 bpm.  Normal axis.  Borderline prolonged QT interval, borderline short PR interval.  No ST or T wave changes.  No prior ECG available for comparison.  Radiology DG Chest Port 1 View  Result Date: 07/26/2020 CLINICAL DATA:  Questionable sepsis EXAM: PORTABLE CHEST 1 VIEW COMPARISON:  None. FINDINGS: Low lung volumes. Heart and mediastinal contours are within normal limits. No focal opacities or effusions. No acute bony abnormality. IMPRESSION: No active disease. Electronically Signed   By: Charlett Nose M.D.   On: 07/26/2020 02:41   CT HEAD CODE STROKE WO CONTRAST  Result Date: 07/26/2020 CLINICAL DATA:  Code stroke.  Acute loss of consciousness EXAM: CT HEAD WITHOUT CONTRAST TECHNIQUE: Contiguous axial images were obtained from  the base of the skull through the vertex without intravenous contrast. COMPARISON:  None. FINDINGS: Brain: There is no mass, hemorrhage or extra-axial collection. The size and configuration of the ventricles and extra-axial CSF spaces are normal. The brain parenchyma is normal, without evidence of acute or chronic infarction. Vascular: No abnormal hyperdensity of the major intracranial arteries or dural venous sinuses. No intracranial atherosclerosis. Skull: The visualized skull base, calvarium and extracranial soft tissues are normal. Sinuses/Orbits: No fluid levels or advanced mucosal thickening of the visualized paranasal sinuses. No mastoid or middle ear effusion. The  orbits are normal. ASPECTS [facility] Stroke Program Early CT Score) - Ganglionic level infarction (caudate, lentiform nuclei, internal capsule, insula, M1-M3 cortex): 7 - Supraganglionic infarction (M4-M6 cortex): 3 Total score (0-10 with 10 being normal): 10 IMPRESSION: 1. Normal head CT. 2. ASPECTS is 10. These results were called by telephone at the time of interpretation on 07/26/2020 at 1:44 am to provider Remus Hagedorn [facility] , who verbally acknowledged these results. Electronically Signed   By: Deatra Robinson M.D.   On: 07/26/2020 01:44    Procedures Procedures  CRITICAL CARE Performed by: Dione Booze Total critical care time: 90 minutes Critical care time was exclusive of separately billable procedures and treating other patients. Critical care was necessary to treat or prevent imminent or life-threatening deterioration. Critical care was time spent personally by me on the following activities: development of treatment plan with patient and/or surrogate as well as nursing, discussions with consultants, evaluation of patient's response to treatment, examination of patient, obtaining history from patient or surrogate, ordering and performing treatments and interventions, ordering and review of laboratory studies, ordering and review of  radiographic studies, pulse oximetry and re-evaluation of patient's condition.  Medications Ordered in ED Medications  lactated ringers infusion ( Intravenous New Bag/Given 07/26/20 0219)  metroNIDAZOLE (FLAGYL) tablet 500 mg (has no administration in time range)  vancomycin (VANCOREADY) IVPB 2000 mg/400 mL (2,000 mg Intravenous New Bag/Given 07/26/20 0220)  lactated ringers bolus 1,000 mL (1,000 mLs Intravenous New Bag/Given 07/26/20 0348)    And  lactated ringers bolus 1,000 mL (has no administration in time range)    And  lactated ringers bolus 1,000 mL (has no administration in time range)    And  lactated ringers bolus 1,000 mL (has no administration in time range)  vancomycin (VANCOCIN) IVPB 1000 mg/200 mL premix (has no administration in time range)  ceFEPIme (MAXIPIME) 2 g in sodium chloride 0.9 % 100 mL IVPB (has no administration in time range)  dextrose 50 % solution 25 mL (25 mLs Intravenous Given 07/26/20 0127)  ceFEPIme (MAXIPIME) 2 g in sodium chloride 0.9 % 100 mL IVPB (0 g Intravenous Stopped 07/26/20 0252)  acetaminophen (TYLENOL) tablet 650 mg (650 mg Oral Given 07/26/20 0222)    ED Course  I have reviewed the triage vital signs and the nursing notes.  Pertinent labs & imaging results that were available during my care of the patient were reviewed by me and considered in my medical decision making (see chart for details).  MDM Rules/Calculators/A&P Collapsed and patient who is 17 days postpartum.  There does seem to be some slight weakness of the left leg compared with the right.  Given timing, I am concerned about intracranial hemorrhage.  She will need to be sent for emergent CT of head.  Old records are reviewed confirming recent induced vaginal delivery with pregnancy complicated by gestational hypertension.  CBG showed borderline hypoglycemia with glucose 62 and she is given a small bolus of glucose.  1:54 AM CT has come back normal.  Full set of vital signs  obtained and patient is noted to be febrile.  She is complaining of feeling cold.  Repeat neurologic exam shows she is awake and alert and conversant, normal motor strength in arms and legs, no pronator drift.  Neurology consult is appreciated and they do recommend cardiology evaluation as well as EEG and MRI.  However, with fever, will need to do septic work-up.  Of note, patient is breast-feeding but has noted no nipple pain or drainage other  than milk.  She denies any urinary urgency or frequency and denies any abdominal pain denies any cough.  Code sepsis is activated.  She is started on empiric antibiotics.  3:58 AM Labs show mild hypokalemia, anemia which is unchanged from prior, WBC normal but with left shift.  Lactic acid is elevated and she is started on early goal-directed fluid therapy.  Chest x-ray shows no evidence of pneumonia.  Urinalysis is still pending.  Case is discussed with Dr. Thomes DinningAdefeso of [facility], who agrees to admit the patient.  Final Clinical Impression(s) / ED Diagnoses Final diagnoses:  Sepsis due to undetermined organism (HCC)  Hypoglycemia  Normocytic anemia    Rx / DC Orders ED Discharge Orders    None       Dione BoozeGlick, Hanifa Antonetti, MD 07/26/20 0400  Respiratory panel is positive for COVID-19.  However, I do not feel her presentation is related to the COVID-19 infection.  Sepsis treatment is continued.   Dione BoozeGlick, Dyke Weible, MD 07/26/20 [phone]

## 2020-07-26 NOTE — ED Notes (Signed)
EEG being done.

## 2020-07-26 NOTE — Progress Notes (Signed)
EEG Completed; Results Pending  

## 2020-07-26 NOTE — Progress Notes (Signed)
Code stroke  Call time  110am  Ems in route Beeper    Start   130 Finish   137  Soc   137 Epic   137 Rad   138

## 2020-07-26 NOTE — Discharge Summary (Signed)
Abigail Perez, is a 24 y.o. female  DOB [birth year]  MRN [id number].  Admission date:  07/26/2020  Admitting Physician  Frankey Shown, DO  Discharge Date:  07/26/2020   Primary MD  Patient, No Pcp Per  Recommendations for primary care physician for things to follow:   1) You are strongly advised to  to isolate/quarantine for at least 21 days from the date of your diagnoses with COVID-19 infection--please always wear a mask if you have to go outside the house  2) please avoid close contact with you to your newborn son for at least 21 days (until [place] Year at the earliest)--- due to significant risk of Covid 19 infection--- you may pump your breast milk and have somebody else help you feed your newborn from a feeding bottle with your breastmilk  3) video/virtual visit with your primary care physician within a week advised  4) please take Keflex/cephalexin antibiotic as prescribed for presumed UTI/urinary tract infection  Admission Diagnosis  Acute febrile illness [R50.9]   Discharge Diagnosis  Acute febrile illness [R50.9]    Active Problems:   Gestational hypertension, third trimester   Acute febrile illness   Syncope   COVID-19 virus infection      Past Medical History:  Diagnosis Date  . GERD (gastroesophageal reflux disease)     Past Surgical History:  Procedure Laterality Date  . NO PAST SURGERIES       HPI  from the history and physical done on the day of admission:    Chief Complaint: Code stroke  HPI: Abigail Perez is a 24 y.o. female with medical history significant for gestational hypertension and GERD who presents to the emergency department via EMS due to suspicion for stroke.  Patient remembered that she was having chills when she suddenly felt some left-sided weakness.  It was reported that patient states that she was holding her baby when she collapsed.  She is 17 days  postpartum.  Patient states that she was in her state of rest prior to onset of symptoms.  She was unsure what led to the symptoms.  She complains of having bloody discharge when she wipes (?Lochia rubra), this has been ongoing since birth delivery.  Patient denies chest pain, shortness of breath, headache, nausea, vomiting, abdominal pain  ED Course:  In the emergency department, she presented with a temperature of 103.59F and intermittently tachypneic.  Work-up in the ED showed normocytic anemia, hypokalemia, hypoglycemia, lactic acid 4.0, ALP 132.  SARS coronavirus 2 was positive. Chest X ray showed no active disease.  CT of head without contrast showed normal head CT. Patient was empirically started on IV cefepime, Flagyl and vancomycin.  IV hydration with 2 L of LR was provided.  Teleneurologist was consulted and recommended further stroke work-up hospitalist was asked to admit patient for further evaluation and management.     Hospital Course:    1) COVID-19 infection--- no hypoxia, CXR without pneumonia--- patient with fever-okay to treat symptomatically -Patient BMI is >>35, she is about 8  days postpartum, onset of symptoms within the last 4 days -Patient appears to be candidate for monoclonal antibody infusion---requested from pharmacy -Pt is unvaccinated -Isolation precautions emphasized especially given that patient has a newborn at home--please see discharge instructions  2) possible sepsis secondary to UTI--- sepsis pathophysiology improved with IV fluids lactic acidosis improved -Patient received IV vancomycin and cefepime and Flagyl -Okay to discharge home on Keflex for presumed UTI  3)Post-Partum State----status post vaginal delivery on 07/08/2020, now Covid positive --Patient is high risk for thromboembolic events/hypercoagulable time due to postpartum state and COVID-19 infection --- need to keep moving and stay ambulatory emphasized to patient, also needs to stay  hydrated  4) right arm weakness and numbness--stroke work-up including CT head, brain MRI, echocardiogram and carotid artery Dopplers without significant abnormalities --Specifically no acute stroke on cranial imaging studies -Official EEG report pending at the time of discharge  5) gestational HTN--- continue amlodipine and follow-up with OB/GYN  Discharge Condition: stable without hypoxia  Follow UP---with obgyn    Consults obtained - na  Diet and Activity recommendation:  As advised  Discharge Instructions    Discharge Instructions    Call MD for:  difficulty breathing, headache or visual disturbances   Complete by: As directed    Call MD for:  extreme fatigue   Complete by: As directed    Call MD for:  persistant dizziness or light-headedness   Complete by: As directed    Call MD for:  persistant nausea and vomiting   Complete by: As directed    Call MD for:  severe uncontrolled pain   Complete by: As directed    Call MD for:  temperature >100.4   Complete by: As directed    Diet - low sodium heart healthy   Complete by: As directed    Discharge instructions   Complete by: As directed    1) You are strongly advised to  to isolate/quarantine for at least 21 days from the date of your diagnoses with COVID-19 infection--please always wear a mask if you have to go outside the house  2) please avoid close contact with you to your newborn son for at least 21 days (until [place] Year at the earliest)--- due to significant risk of Covid 19 infection--- you may pump your breast milk and have somebody else help you feed your newborn from a feeding bottle with your breastmilk  3) video/virtual visit with your primary care physician within a week advised  4) please take Keflex/cephalexin antibiotic as prescribed for presumed UTI/urinary tract infection   Increase activity slowly   Complete by: As directed    Place in observation (patient's expected length of stay will be less than 2  midnights)   Complete by: As directed        Discharge Medications     Allergies as of 07/26/2020   No Known Allergies     Medication List    STOP taking these medications   ibuprofen 600 MG tablet Commonly known as: ADVIL     TAKE these medications   acetaminophen 325 MG tablet Commonly known as: Tylenol Take 2 tablets (650 mg total) by mouth every 4 (four) hours as needed (for pain scale < 4).   amLODipine 5 MG tablet Commonly known as: NORVASC Take 1 tablet (5 mg total) by mouth daily.   cephALEXin 500 MG capsule Commonly known as: Keflex Take 1 capsule (500 mg total) by mouth 3 (three) times daily for 5 days.  prenatal multivitamin Tabs tablet Take 1 tablet by mouth daily at 12 noon.       Major procedures and Radiology Reports - PLEASE review detailed and final reports for all details, in brief -   MR BRAIN WO CONTRAST  Result Date: 07/26/2020 CLINICAL DATA:  Acute neuro deficit. Suspect stroke. Right arm weakness and numbness. EXAM: MRI HEAD WITHOUT CONTRAST TECHNIQUE: Multiplanar, multiecho pulse sequences of the brain and surrounding structures were obtained without intravenous contrast. COMPARISON:  CT head 07/26/2020 FINDINGS: Brain: Motion degraded study. Negative for acute or chronic infarction. No intracranial hemorrhage. 1 cm calcification left anterior frontal bone projecting into the subarachnoid space. This appears attached to the frontal bone on CT. No brain edema. No brain edema. Negative for intracranial hemorrhage. Ventricle size normal. Vascular: Normal arterial flow voids. Skull and upper cervical spine: Negative Sinuses/Orbits: Paranasal sinuses clear.  Negative orbit Other: None IMPRESSION: No acute abnormality. 1 cm calcified lesion left anterior frontal lobe appears benign. Possible exostosis. Meningioma possible. Electronically Signed   By: Marlan Palauharles  Clark M.D.   On: 07/26/2020 14:06   US Carotid Bilateral  Result Date: 07/26/2020 CLINICAL  DATA:  Postpartum.  Left-sided weakness. EXAM: BILATERAL CAROTID DUPLEX ULTRASOUND TECHNIQUE: Wallace CullensGray scale imaging, color Doppler and duplex ultrasound were performed of bilateral carotid and vertebral arteries in the neck. COMPARISON:  None. FINDINGS: Criteria: Quantification of carotid stenosis is based on velocity parameters that correlate the residual internal carotid diameter with NASCET-based stenosis levels, using the diameter of the distal internal carotid lumen as the denominator for stenosis measurement. The following velocity measurements were obtained: RIGHT ICA: 87/18 cm/sec CCA: 108/20 cm/sec SYSTOLIC ICA/CCA RATIO:  0.8 ECA: 106 cm/sec LEFT ICA: 8414 cm/sec CCA: 137/31 cm/sec SYSTOLIC ICA/CCA RATIO:  0.6 ECA: 107 cm/sec RIGHT CAROTID ARTERY: Carotid arteries are patent without plaque or stenosis. External carotid artery is patent with normal waveform. Normal waveforms and velocities in the internal carotid artery. RIGHT VERTEBRAL ARTERY: Antegrade flow and normal waveform in the right vertebral artery. LEFT CAROTID ARTERY: Carotid arteries are patent without plaque or stenosis. External carotid artery is patent with normal waveform. Normal waveforms and velocities in the internal carotid artery. LEFT VERTEBRAL ARTERY: Antegrade flow and normal waveform in the left vertebral artery. IMPRESSION: Normal carotid artery duplex.  Negative for carotid artery stenosis. Electronically Signed   By: Richarda OverlieAdam  Henn M.D.   On: 07/26/2020 13:38   DG Chest Port 1 View  Result Date: 07/26/2020 CLINICAL DATA:  Questionable sepsis EXAM: PORTABLE CHEST 1 VIEW COMPARISON:  None. FINDINGS: Low lung volumes. Heart and mediastinal contours are within normal limits. No focal opacities or effusions. No acute bony abnormality. IMPRESSION: No active disease. Electronically Signed   By: Charlett NoseKevin  Dover M.D.   On: 07/26/2020 02:41   ECHOCARDIOGRAM COMPLETE  Result Date: 07/26/2020    ECHOCARDIOGRAM REPORT   Patient Name:    Abigail Perez Date of Exam: 07/26/2020 Medical Rec #:  [id number]       Height:       67.0 in Accession #:    [id number]      Weight:       281.6 lb Date of Birth:  08/05/1996        BSA:          2.339 m Patient Age:    24 years        BP:           140/71 mmHg Patient Gender: F  HR:           80 bpm. [facility] Procedure: 2D Echo Indications:    Stroke 434.91 / I163.9  History:        Patient has no prior history of Echocardiogram examinations.                 Signs/Symptoms:Syncope; Risk Factors:Non-Smoker. Gestational                 HTN, Covid 19.  Sonographer:    Jeryl Columbia RDCS (AE) Referring Phys: [id number] OLADAPO ADEFESO IMPRESSIONS  1. Left ventricular ejection fraction, by estimation, is 60 to 65%. The left ventricle has normal function. The left ventricle has no regional wall motion abnormalities. Left ventricular diastolic parameters were normal.  2. Right ventricular systolic function is normal. The right ventricular size is normal.  3. The mitral valve is normal in structure. No evidence of mitral valve regurgitation. No evidence of mitral stenosis.  4. The aortic valve was not well visualized. Aortic valve regurgitation is not visualized. No aortic stenosis is present.  5. The inferior vena cava is normal in size with greater than 50% respiratory variability, suggesting right atrial pressure of 3 mmHg. FINDINGS  Left Ventricle: Left ventricular ejection fraction, by estimation, is 60 to 65%. The left ventricle has normal function. The left ventricle has no regional wall motion abnormalities. The left ventricular internal cavity size was normal in size. There is  no left ventricular hypertrophy. Left ventricular diastolic parameters were normal. Right Ventricle: The right ventricular size is normal. No increase in right ventricular wall thickness. Right ventricular systolic function is normal. Left Atrium: Left atrial size was normal in size. Right Atrium: Right atrial  size was normal in size. Pericardium: There is no evidence of pericardial effusion. Mitral Valve: The mitral valve is normal in structure. No evidence of mitral valve regurgitation. No evidence of mitral valve stenosis. Tricuspid Valve: The tricuspid valve is normal in structure. Tricuspid valve regurgitation is trivial. No evidence of tricuspid stenosis. Aortic Valve: The aortic valve was not well visualized. Aortic valve regurgitation is not visualized. No aortic stenosis is present. Aortic valve mean gradient measures 7.8 mmHg. Aortic valve peak gradient measures 17.0 mmHg. Aortic valve area, by VTI measures 1.78 cm. Pulmonic Valve: The pulmonic valve was not well visualized. Pulmonic valve regurgitation is not visualized. No evidence of pulmonic stenosis. Aorta: The aortic root is normal in size and structure. Pulmonary Artery: Indeterminant PASP, inadequate TR jet. Venous: The inferior vena cava is normal in size with greater than 50% respiratory variability, suggesting right atrial pressure of 3 mmHg. IAS/Shunts: No atrial level shunt detected by color flow Doppler.  LEFT VENTRICLE PLAX 2D LVIDd:         4.67 cm  Diastology LVIDs:         3.09 cm  LV e' medial:    12.80 cm/s LV PW:         1.00 cm  LV E/e' medial:  7.5 LV IVS:        1.00 cm  LV e' lateral:   14.30 cm/s LVOT diam:     2.00 cm  LV E/e' lateral: 6.7 LV SV:         64 LV SV Index:   27 LVOT Area:     3.14 cm  RIGHT VENTRICLE RV S prime:     16.90 cm/s TAPSE (M-mode): 2.1 cm LEFT ATRIUM  Index       RIGHT ATRIUM          Index LA diam:        3.60 cm 1.54 cm/m  RA Area:     8.09 cm LA Vol (A2C):   42.4 ml 18.13 ml/m RA Volume:   14.50 ml 6.20 ml/m LA Vol (A4C):   40.9 ml 17.49 ml/m LA Biplane Vol: 43.5 ml 18.60 ml/m  AORTIC VALVE AV Area (Vmax):    1.57 cm AV Area (Vmean):   1.66 cm AV Area (VTI):     1.78 cm AV Vmax:           206.32 cm/s AV Vmean:          128.830 cm/s AV VTI:            0.358 m AV Peak Grad:      17.0 mmHg  AV Mean Grad:      7.8 mmHg LVOT Vmax:         102.83 cm/s LVOT Vmean:        67.884 cm/s LVOT VTI:          0.203 m LVOT/AV VTI ratio: 0.57  AORTA Ao Root diam: 2.50 cm MITRAL VALVE MV Area (PHT): 5.93 cm    SHUNTS MV Decel Time: 128 msec    Systemic VTI:  0.20 m MV E velocity: 96.40 cm/s  Systemic Diam: 2.00 cm MV A velocity: 84.40 cm/s MV E/A ratio:  1.14 Dina Rich MD Electronically signed by Dina Rich MD Signature Date/Time: 07/26/2020/11:38:04 AM    Final    CT HEAD CODE STROKE WO CONTRAST  Result Date: 07/26/2020 CLINICAL DATA:  Code stroke.  Acute loss of consciousness EXAM: CT HEAD WITHOUT CONTRAST TECHNIQUE: Contiguous axial images were obtained from the base of the skull through the vertex without intravenous contrast. COMPARISON:  None. FINDINGS: Brain: There is no mass, hemorrhage or extra-axial collection. The size and configuration of the ventricles and extra-axial CSF spaces are normal. The brain parenchyma is normal, without evidence of acute or chronic infarction. Vascular: No abnormal hyperdensity of the major intracranial arteries or dural venous sinuses. No intracranial atherosclerosis. Skull: The visualized skull base, calvarium and extracranial soft tissues are normal. Sinuses/Orbits: No fluid levels or advanced mucosal thickening of the visualized paranasal sinuses. No mastoid or middle ear effusion. The orbits are normal. ASPECTS [facility] Stroke Program Early CT Score) - Ganglionic level infarction (caudate, lentiform nuclei, internal capsule, insula, M1-M3 cortex): 7 - Supraganglionic infarction (M4-M6 cortex): 3 Total score (0-10 with 10 being normal): 10 IMPRESSION: 1. Normal head CT. 2. ASPECTS is 10. These results were called by telephone at the time of interpretation on 07/26/2020 at 1:44 am to provider [facility] , who verbally acknowledged these results. Electronically Signed   By: Deatra Robinson M.D.   On: 07/26/2020 01:44    Micro Results    Recent Results  (from the past 240 hour(s))  Blood Culture (routine x 2)     Status: None (Preliminary result)   Collection Time: 07/26/20  2:18 AM   Specimen: BLOOD  Result Value Ref Range Status   Specimen Description BLOOD LEFT ANTECUBITAL  Final   Special Requests AEROBIC BOTTLE ONLY Blood Culture adequate volume  Final   Culture   Final    NO GROWTH < 12 HOURS Performed at [facility], [address], [place], [place] [address]    Report Status PENDING  Incomplete  Blood Culture (routine x 2)  Status: None (Preliminary result)   Collection Time: 07/26/20  2:31 AM   Specimen: BLOOD RIGHT HAND  Result Value Ref Range Status   Specimen Description BLOOD RIGHT HAND  Final   Special Requests   Final    BOTTLES DRAWN AEROBIC AND ANAEROBIC Blood Culture adequate volume   Culture   Final    NO GROWTH < 12 HOURS Performed at [facility], [address], [place], [place] [address]    Report Status PENDING  Incomplete  Resp Panel by RT-PCR (Flu A&B, Covid) Nasopharyngeal Swab     Status: Abnormal   Collection Time: 07/26/20  3:11 AM   Specimen: Nasopharyngeal Swab; Nasopharyngeal(NP) swabs in vial transport medium  Result Value Ref Range Status   SARS Coronavirus 2 by RT PCR POSITIVE (A) NEGATIVE Final    Comment: RESULT CALLED TO, READ BACK BY AND VERIFIED WITH: M BRAME,RN@[id number]  07/26/20 MKELLY (NOTE) SARS-CoV-2 target nucleic acids are DETECTED.  The SARS-CoV-2 RNA is generally detectable in upper respiratory specimens during the acute phase of infection. Positive results are indicative of the presence of the identified virus, but do not rule out bacterial infection or co-infection with other pathogens not detected by the test. Clinical correlation with patient history and other diagnostic information is necessary to determine patient infection status. The expected result is Negative.  Fact Sheet for Patients: BloggerCourse.com  Fact Sheet for Healthcare  Providers: SeriousBroker.it  This test is not yet approved or cleared by the [place] FDA and  has been authorized for detection and/or diagnosis of SARS-CoV-2 by FDA under an Emergency Use Authorization (EUA).  This EUA will remain in effect (meaning this test can be u sed) for the duration of  the COVID-19 declaration under Section 564(b)(1) of the Act, 21 U.S.C. section 360bbb-3(b)(1), unless the authorization is terminated or revoked sooner.     Influenza A by PCR NEGATIVE NEGATIVE Final   Influenza B by PCR NEGATIVE NEGATIVE Final    Comment: (NOTE) The Xpert Xpress SARS-CoV-2/FLU/RSV plus assay is intended as an aid in the diagnosis of influenza from Nasopharyngeal swab specimens and should not be used as a sole basis for treatment. Nasal washings and aspirates are unacceptable for Xpert Xpress SARS-CoV-2/FLU/RSV testing.  Fact Sheet for Patients: BloggerCourse.com  Fact Sheet for Healthcare Providers: SeriousBroker.it  This test is not yet approved or cleared by the [place] FDA and has been authorized for detection and/or diagnosis of SARS-CoV-2 by FDA under an Emergency Use Authorization (EUA). This EUA will remain in effect (meaning this test can be used) for the duration of the COVID-19 declaration under Section 564(b)(1) of the Act, 21 U.S.C. section 360bbb-3(b)(1), unless the authorization is terminated or revoked.  Performed at [facility], [address], [place], [place] [address]        Today   Subjective    Abigail Perez today has no new complaints -Fevers related to COVID-19 infection and possible UTI -No chest pains no productive cough no hypoxia          Patient has been seen and examined prior to discharge   Objective   Blood pressure 122/67, pulse 80, temperature (!) 102.3 F (39.1 C), temperature source Oral, resp. rate 20, SpO2 100 %, unknown if  currently breastfeeding.   Intake/Output Summary (Last 24 hours) at 07/26/2020 1744 Last data filed at 07/26/2020 0617 Gross per 24 hour  Intake 4503.3 ml  Output --  Net 4503.3 ml    Exam Gen:- Awake Alert, no acute  distress  HEENT:- [place].AT, No sclera icterus Neck-Supple Neck,No JVD,.  Lungs-  CTAB , good air movement bilaterally  CV- S1, S2 normal, regular Abd-  +ve B.Sounds, Abd Soft, No tenderness, increased truncal adiposity    Extremity/Skin:- No  edema,   good pulses Psych-affect is appropriate, oriented x3 Neuro-no new focal deficits, no tremors , resolved right-sided weakness and numbness   Data Review   CBC w Diff:  Lab Results  Component Value Date   WBC 7.3 07/26/2020   HGB 10.8 (L) 07/26/2020   HGB 10.6 (L) 04/11/2020   HCT 36.0 07/26/2020   HCT 32.3 (L) 04/11/2020   PLT 308 07/26/2020   PLT 416 04/11/2020   LYMPHOPCT 9 07/26/2020   MONOPCT 6 07/26/2020   EOSPCT 1 07/26/2020   BASOPCT 0 07/26/2020    CMP:  Lab Results  Component Value Date   NA 134 (L) 07/26/2020   NA 137 03/02/2020   K 3.1 (L) 07/26/2020   CL 104 07/26/2020   CO2 22 07/26/2020   BUN 9 07/26/2020   BUN 6 03/02/2020   CREATININE 0.60 07/26/2020   CREATININE 0.72 10/10/2016   PROT 7.0 07/26/2020   PROT 6.4 03/02/2020   ALBUMIN 3.5 07/26/2020   ALBUMIN 3.8 (L) 03/02/2020   BILITOT 0.8 07/26/2020   BILITOT <0.2 03/02/2020   ALKPHOS 123 07/26/2020   AST 20 07/26/2020   ALT 13 07/26/2020  . Total Discharge time is about 33 minutes  Shon Hale M.D on 07/26/2020 at 5:44 PM  Go to [email or web address] -  for contact info  [facility] - Office  [phone]

## 2020-07-26 NOTE — Progress Notes (Signed)
*  PRELIMINARY RESULTS* Echocardiogram 2D Echocardiogram has been performed.  Abigail Perez 07/26/2020, 10:05 AM

## 2020-07-27 LAB — CBG MONITORING, ED: Glucose-Capillary: 62 mg/dL — ABNORMAL LOW (ref 70–99)

## 2020-07-27 LAB — URINE CULTURE

## 2020-07-27 NOTE — Procedures (Signed)
Patient Name: Abigail Perez  MRN: [id number]  Epilepsy Attending: Charlsie Quest  Referring Physician/Provider: Dr Frankey Shown Date: 07/26/2020 Duration: 23.10 mins  Patient history: 24yo F with sudden onset of transient left sided weakness. EEG to evaluate for seizure  Level of alertness: Awake, asleep  AEDs during EEG study: None  Technical aspects: This EEG study was done with scalp electrodes positioned according to the 10-20 International system of electrode placement. Electrical activity was acquired at a sampling rate of 500Hz  and reviewed with a high frequency filter of 70Hz  and a low frequency filter of 1Hz . EEG data were recorded continuously and digitally stored.   Description: The posterior dominant rhythm consists of 10 Hz activity of moderate voltage (25-35 uV) seen predominantly in posterior head regions, symmetric and reactive to eye opening and eye closing. Sleep was characterized by vertex waves, sleep spindles (12 to 14 Hz), maximal frontocentral region. Hyperventilation and photic stimulation were not performed.     IMPRESSION: This study is within normal limits. No seizures or epileptiform discharges were seen throughout the recording.  [name] 

## 2020-08-01 LAB — CULTURE, BLOOD (ROUTINE X 2)
Culture: NO GROWTH
Culture: NO GROWTH
Special Requests: ADEQUATE
Special Requests: ADEQUATE

## 2020-08-02 ENCOUNTER — Other Ambulatory Visit: Payer: Medicaid Other

## 2020-08-16 ENCOUNTER — Ambulatory Visit: Payer: Medicaid Other | Admitting: Obstetrics and Gynecology

## 2021-01-30 ENCOUNTER — Ambulatory Visit: Payer: Medicaid Other | Admitting: Obstetrics and Gynecology

## 2021-06-26 IMAGING — CT CT HEAD CODE STROKE
3 series · 15 of 47 positions shown, 18 images · non-contrast
Comparison: None.

CLINICAL DATA: Code stroke.  Acute loss of consciousness

EXAM:
CT HEAD WITHOUT CONTRAST
TECHNIQUE: Contiguous axial images were obtained from the base of the skull
through the vertex without intravenous contrast.

[Series 2: head w o · axial · 0.47mm/px · z∈[-108,+28]mm · 9 of 33 slices shown, 12 images]
[im 3/33  brain]
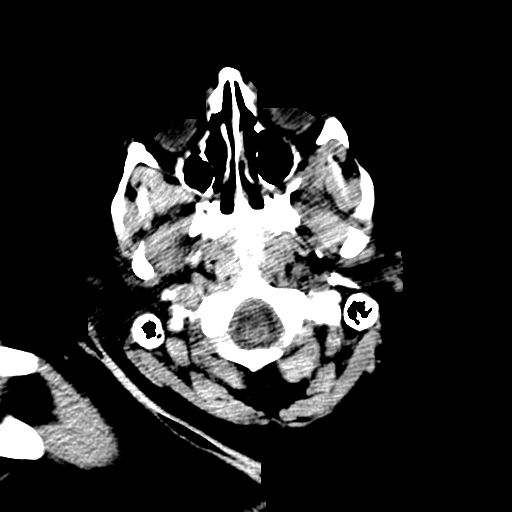
[im 3/33  bone]
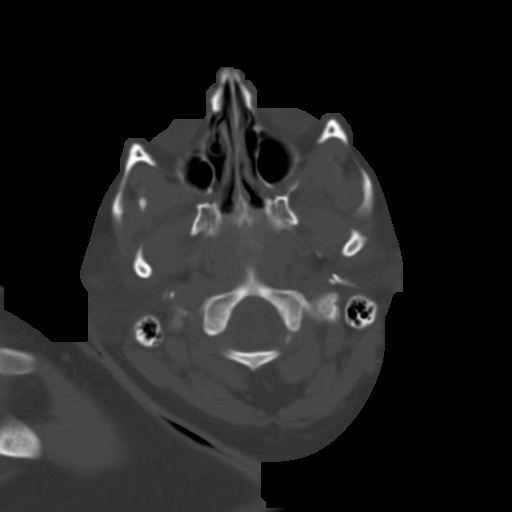
[im 6/33  brain]
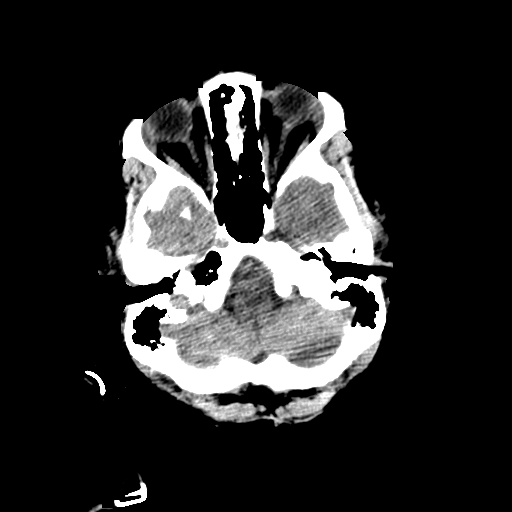
[im 9/33  brain]
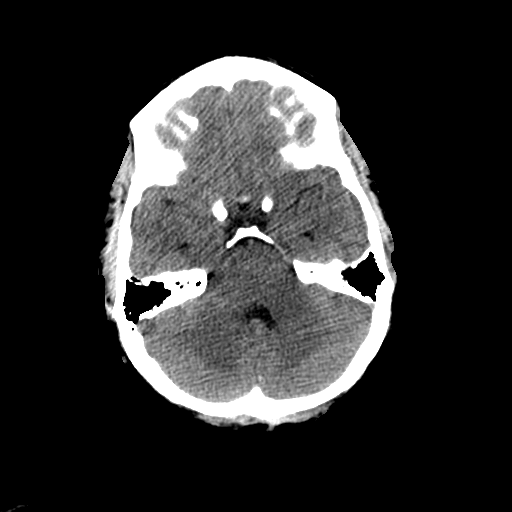
[im 13/33  brain]
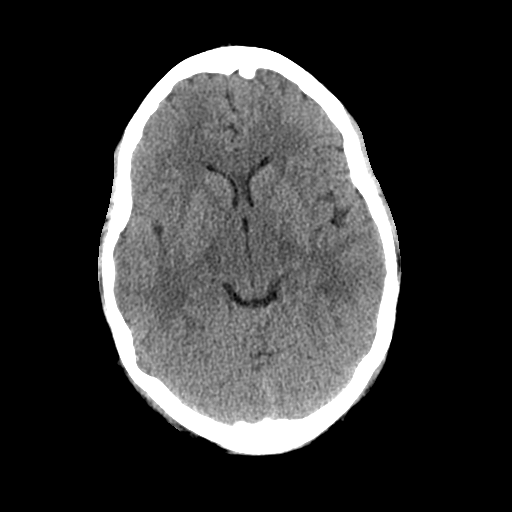
[im 17/33  brain]
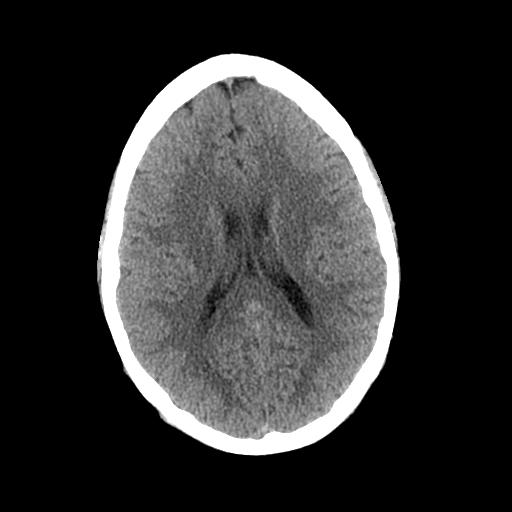
[im 17/33  bone]
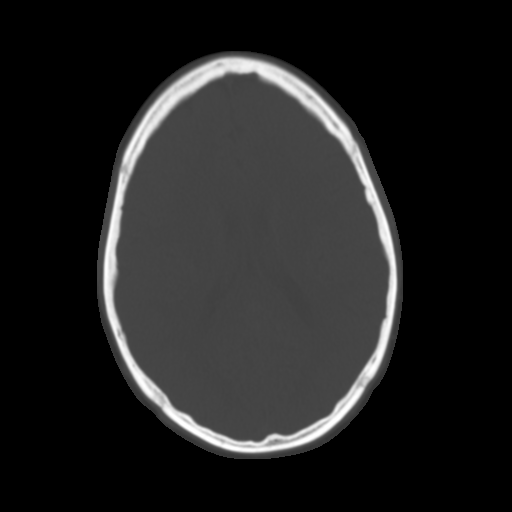
[im 20/33  brain]
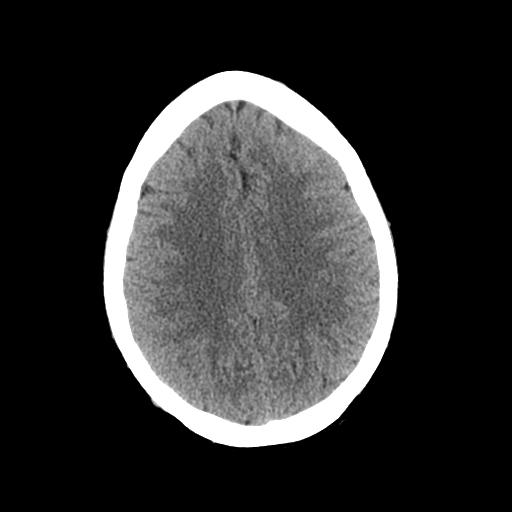
[im 24/33  brain]
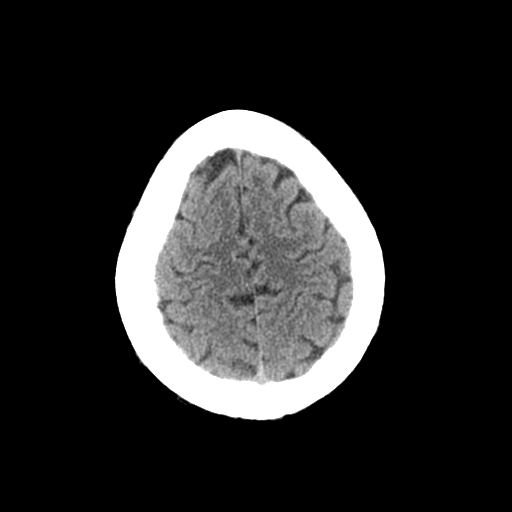
[im 27/33  brain]
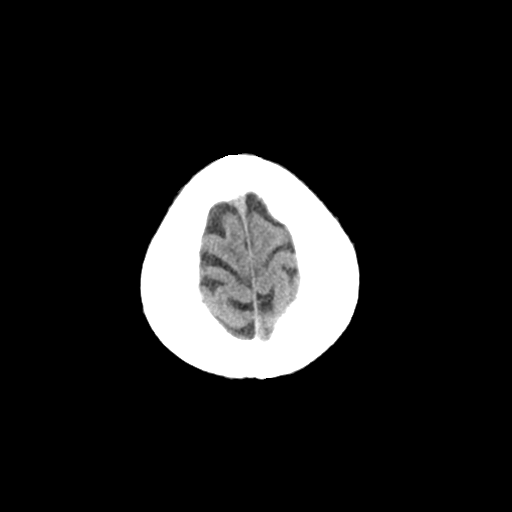
[im 30/33  brain]
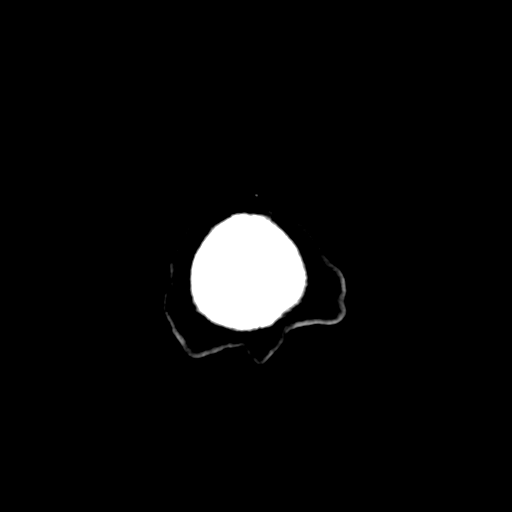
[im 30/33  bone]
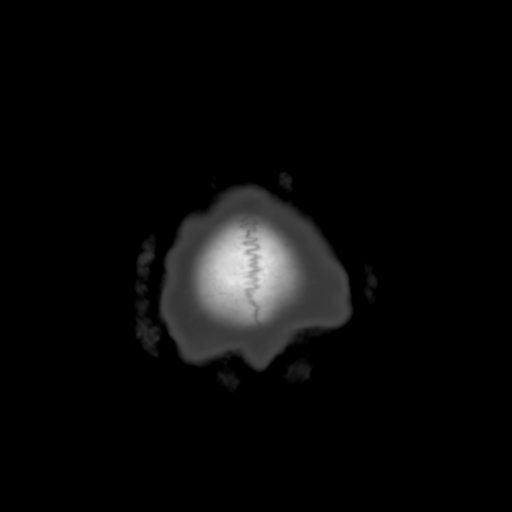

[Series 4: coronal soft · coronal · 0.33mm/px · 3 of 70 slices shown]
[im 24/70  brain]
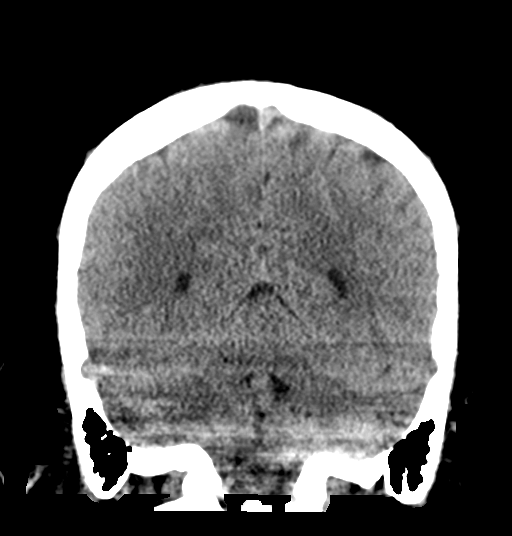
[im 31/70  brain]
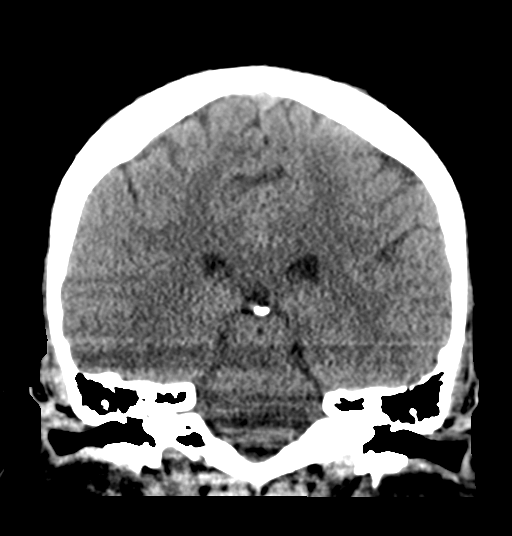
[im 39/70  brain]
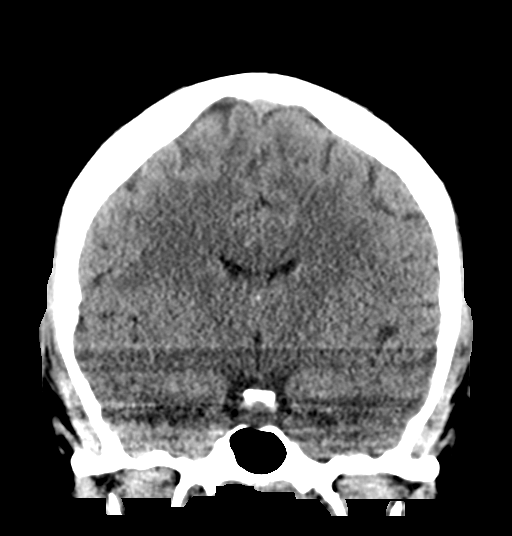

[Series 5: sagittal soft · sagittal · 0.35mm/px · 3 of 57 slices shown]
[im 19/57  brain]
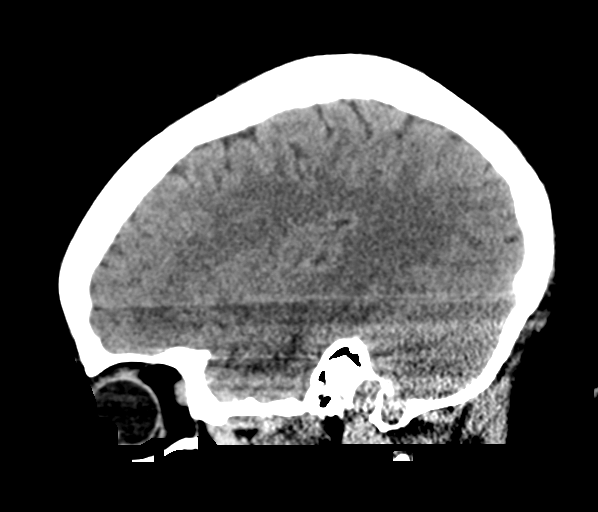
[im 29/57  brain]
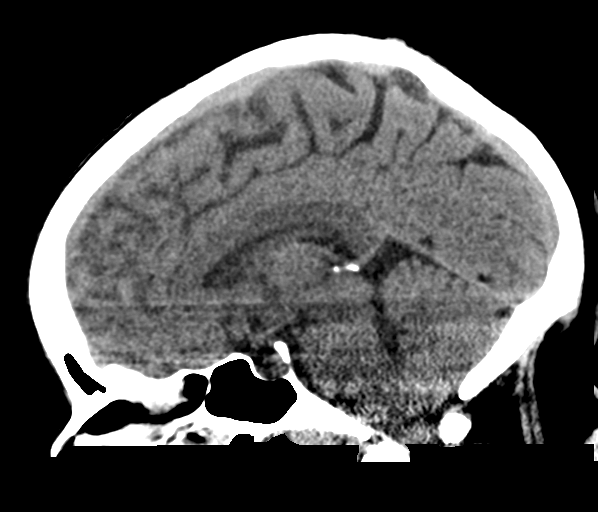
[im 38/57  brain]
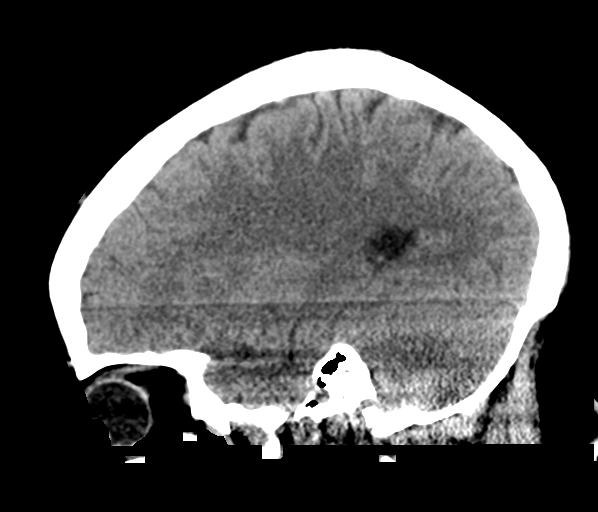

[15 of 47 positions shown; findings below may reference images not displayed]

FINDINGS: Brain: There is no mass, hemorrhage or extra-axial collection. The
size and configuration of the ventricles and extra-axial CSF spaces
are normal. The brain parenchyma is normal, without evidence of
acute or chronic infarction.

Vascular: No abnormal hyperdensity of the major intracranial
arteries or dural venous sinuses. No intracranial atherosclerosis.

Skull: The visualized skull base, calvarium and extracranial soft
tissues are normal.

Sinuses/Orbits: No fluid levels or advanced mucosal thickening of
the visualized paranasal sinuses. No mastoid or middle ear effusion.
The orbits are normal.

ASPECTS (Alberta Stroke Program Early CT Score)

- Ganglionic level infarction (caudate, lentiform nuclei, internal
capsule, insula, M1-M3 cortex): 7

- Supraganglionic infarction (M4-M6 cortex): 3

Total score (0-10 with 10 being normal): 10
IMPRESSION: 1. Normal head CT.
2. ASPECTS is 10.

These results were called by telephone at the time of interpretation
on 07/26/2020 at [DATE] to provider KIE KEEN , who verbally
acknowledged these results.
